# Patient Record
Sex: Female | Born: 1935 | Race: White | Hispanic: No | Smoking: Never smoker
Health system: Southern US, Community
[De-identification: ages and names within clinical notes are randomized; demographics above are authoritative.]

---

## 1998-11-08 ENCOUNTER — Ambulatory Visit (HOSPITAL_COMMUNITY): Admission: RE | Admit: 1998-11-08 | Discharge: 1998-11-08 | Payer: Self-pay | Admitting: Family Medicine

## 1999-07-31 ENCOUNTER — Other Ambulatory Visit: Admission: RE | Admit: 1999-07-31 | Discharge: 1999-07-31 | Payer: Self-pay | Admitting: Gynecology

## 1999-09-19 ENCOUNTER — Encounter: Payer: Self-pay | Admitting: Gynecology

## 1999-09-19 ENCOUNTER — Encounter: Admission: RE | Admit: 1999-09-19 | Discharge: 1999-09-19 | Payer: Self-pay | Admitting: Gynecology

## 2000-07-30 ENCOUNTER — Encounter: Admission: RE | Admit: 2000-07-30 | Discharge: 2000-07-30 | Payer: Self-pay | Admitting: Family Medicine

## 2000-07-30 ENCOUNTER — Encounter: Payer: Self-pay | Admitting: Family Medicine

## 2000-07-30 ENCOUNTER — Encounter (INDEPENDENT_AMBULATORY_CARE_PROVIDER_SITE_OTHER): Payer: Self-pay | Admitting: Specialist

## 2000-07-30 ENCOUNTER — Inpatient Hospital Stay (HOSPITAL_COMMUNITY): Admission: EM | Admit: 2000-07-30 | Discharge: 2000-08-12 | Payer: Self-pay | Admitting: Emergency Medicine

## 2000-09-27 ENCOUNTER — Encounter: Admission: RE | Admit: 2000-09-27 | Discharge: 2000-09-27 | Payer: Self-pay | Admitting: Family Medicine

## 2000-09-28 ENCOUNTER — Other Ambulatory Visit: Admission: RE | Admit: 2000-09-28 | Discharge: 2000-09-28 | Payer: Self-pay | Admitting: Gynecology

## 2000-10-19 ENCOUNTER — Encounter: Payer: Self-pay | Admitting: Gynecology

## 2000-10-19 ENCOUNTER — Encounter: Admission: RE | Admit: 2000-10-19 | Discharge: 2000-10-19 | Payer: Self-pay | Admitting: Gynecology

## 2001-10-11 ENCOUNTER — Other Ambulatory Visit: Admission: RE | Admit: 2001-10-11 | Discharge: 2001-10-11 | Payer: Self-pay | Admitting: Gynecology

## 2001-10-27 ENCOUNTER — Encounter: Payer: Self-pay | Admitting: Gynecology

## 2001-10-27 ENCOUNTER — Encounter: Admission: RE | Admit: 2001-10-27 | Discharge: 2001-10-27 | Payer: Self-pay | Admitting: Gynecology

## 2002-01-06 ENCOUNTER — Encounter (INDEPENDENT_AMBULATORY_CARE_PROVIDER_SITE_OTHER): Payer: Self-pay | Admitting: Specialist

## 2002-01-06 ENCOUNTER — Ambulatory Visit (HOSPITAL_BASED_OUTPATIENT_CLINIC_OR_DEPARTMENT_OTHER): Admission: RE | Admit: 2002-01-06 | Discharge: 2002-01-06 | Payer: Self-pay | Admitting: Gynecology

## 2002-10-25 ENCOUNTER — Other Ambulatory Visit: Admission: RE | Admit: 2002-10-25 | Discharge: 2002-10-25 | Payer: Self-pay | Admitting: Gynecology

## 2002-11-13 ENCOUNTER — Encounter: Payer: Self-pay | Admitting: Gynecology

## 2002-11-13 ENCOUNTER — Encounter: Admission: RE | Admit: 2002-11-13 | Discharge: 2002-11-13 | Payer: Self-pay | Admitting: Gynecology

## 2003-11-22 ENCOUNTER — Encounter: Admission: RE | Admit: 2003-11-22 | Discharge: 2003-11-22 | Payer: Self-pay | Admitting: Gynecology

## 2004-11-26 ENCOUNTER — Other Ambulatory Visit: Admission: RE | Admit: 2004-11-26 | Discharge: 2004-11-26 | Payer: Self-pay | Admitting: Gynecology

## 2005-01-14 ENCOUNTER — Encounter: Admission: RE | Admit: 2005-01-14 | Discharge: 2005-01-14 | Payer: Self-pay | Admitting: Gynecology

## 2005-01-23 ENCOUNTER — Encounter: Admission: RE | Admit: 2005-01-23 | Discharge: 2005-01-23 | Payer: Self-pay | Admitting: Gynecology

## 2005-03-12 ENCOUNTER — Encounter: Admission: RE | Admit: 2005-03-12 | Discharge: 2005-03-12 | Payer: Self-pay | Admitting: Gynecology

## 2005-03-12 ENCOUNTER — Encounter (INDEPENDENT_AMBULATORY_CARE_PROVIDER_SITE_OTHER): Payer: Self-pay | Admitting: *Deleted

## 2005-05-14 ENCOUNTER — Encounter: Admission: RE | Admit: 2005-05-14 | Discharge: 2005-05-14 | Payer: Self-pay | Admitting: Surgery

## 2005-11-18 ENCOUNTER — Encounter: Admission: RE | Admit: 2005-11-18 | Discharge: 2005-11-18 | Payer: Self-pay | Admitting: Gynecology

## 2006-05-19 ENCOUNTER — Encounter: Admission: RE | Admit: 2006-05-19 | Discharge: 2006-05-19 | Payer: Self-pay | Admitting: Gynecology

## 2006-12-15 ENCOUNTER — Other Ambulatory Visit: Admission: RE | Admit: 2006-12-15 | Discharge: 2006-12-15 | Payer: Self-pay | Admitting: Gynecology

## 2006-12-29 ENCOUNTER — Encounter: Admission: RE | Admit: 2006-12-29 | Discharge: 2006-12-29 | Payer: Self-pay | Admitting: Gynecology

## 2007-08-24 ENCOUNTER — Encounter: Admission: RE | Admit: 2007-08-24 | Discharge: 2007-08-24 | Payer: Self-pay | Admitting: Gynecology

## 2010-11-30 ENCOUNTER — Encounter: Payer: Self-pay | Admitting: Gynecology

## 2011-03-27 NOTE — Op Note (Signed)
[facility]  Patient:    Elaine Campbell, Elaine Campbell                           MRN: [id number] Proc. Date: 07/30/00 Attending:  Zigmund Daniel, M.D. CC:         Teena Irani. Arlyce Dice, M.D.   Operative Report  PREOPERATIVE DIAGNOSIS:  Probable acute cholecystitis.  POSTOPERATIVE DIAGNOSIS: 1. Perforated appendicitis with abscess and peritonitis. 2. Acute and chronic cholecystitis.  OPERATION: 1. Laparoscopy. 2. Appendectomy. 3. Cholecystectomy (open procedure)  SURGEON:  Zigmund Daniel, M.D.  ANESTHESIA: General.  DESCRIPTION OF PROCEDURE:  After adequate monitoring and uneventful induction of anesthesia and routine preparation and draping of the abdomen, I made a short, transverse infraumbilical incision and carried my dissection down to the fascia, opened it longitudinally, and bluntly entered the peritoneal cavity.  No adhesions were present in the midline area.  I then placed a Hasson cannula and secured with O Vicryl pursestring suture in the fascia and inflated the abdomen with CO2.  I put in a laparoscope and noted fluid and inflammation in the right lower quadrant.  I put in a mid abdominal port and dissected a bit.  There were adhesions of the cecum to the anterior abdominal wall, and there was severe inflammation at the tip of the cecum.  I was not able to adequately dissect the area, so I abandoned the laparoscopic procedure.  I made a midline laparotomy and dissected into the right lower quadrant and pelvis.  I entered a large abscess cavity bounded by small bowel which was beginning to have an obstructive appearance.  I suctioned out the abscess contents and noted that the appendix was in it and appeared to be perforated.  This appeared to be of several days duration as there was considerable chronic inflammation.  There was a fair amount of free fluid. There was abscess in the pelvis along with a good deal of adherent small bowel.  I took  cultures of the abscesses.  The cecum and appendix were rather severely inflamed, and I did not feel I would be able to perform an appendectomy without taking out the cecum.  I mobilized the right colon and the distal ileum back to healthy appearing bowel.  I divided the bowel proximally and distally with a cutting stapler and divided intervening mesentery between clamps and ligated the vessels with 2-0 silks and 2-0 silk ligatures.  I did a side-to-side anastomosis of the ends of the bowel using the cutting stapler and saw that hemostasis was good on the staple lines.  I closed the remaining common defect with the linear stapler and reinforced it with silk suture.  It appeared to be well vascularized and secure closure.  I closed the mesenteric defect with 2-0 silk.  I then further irrigated the abdominal cavity and removed the irrigant.  I felt up in the right upper quadrant and found that the gallbladder was severely scarred and extremely distended and hard.  I felt that it should be removed.  I extended my midline incision upward to adequately expose the gallbladder.  I took down the chronic and acute adhesions of the omentum tothe undersurface of the gallbladder.  I then decompressed the gallbladder with the suction aspirator.  It contained very inspissated bile.  There was a very large, hard stone in the infundibulum and cystic duct area.  It appeared to be obstructing the gallbladder.  I then worked from the  fundus of the gallbladder downward using the cautery and scissors to dissect it out of the liver, getting hemostasis with the cautery.  I dissected down until the gallbladder remained attached to the liver only by the cystic duct and the cystic artery. I clipped the cystic artery with three clips and cut between the two closer to the gallbladder.  I clipped the cystic duct with three clips and then removed the gallbladder.  I copiously irrigated the area and removed the  irrigant. The clips were secure, and there was no evident bleeding or leakage of bile.  Sponge, needle, and instrument counts were correct.  I closed the fascia with running #1 PDS and closed the skin loosely with staples with some saline-soaked gauze packed into the ______ .  The patient tolerated the operation well. DD:  07/30/00 TD:  08/02/00 Job: [id number]

## 2011-03-27 NOTE — Discharge Summary (Signed)
[facility]  Patient:    Elaine Campbell, Elaine Campbell                         MRN: [id number] Adm. Date:  [id number] Disc. Date: [id number] Attending:  Meredith Leeds CC:         Teena Irani. Arlyce Dice, M.D.   Discharge Summary  HISTORY OF PRESENT ILLNESS:  The patient is a 77 year old white female who has had recent upper abdominal pain. She had not responded to treatment for constipation and reflux. The sound of the gallbladder showed evidence of acute cholecystitis and cholelithiasis. I was consulted to see the patient at the emergency department. See history and physical for further details. Prominent findings are that she is allergic to PENICILLIN. She is only on hormone replacement therapy and vitamins. Her only previous surgery was a knee arthroscopy.  PHYSICAL EXAMINATION:  Remarkably only for tenderness in the abdomen, greatest in the right lower quadrant.  HOSPITAL COURSE:  WBC count was elevated. It was felt that the patient possibly had a ruptured appendix. I advised immediate surgery. She underwent laparoscopy and I found that there was both inflammation of the gallbladder and inflammation in the right lower quadrant. I was not able to do successful laparoscopic surgery. I did a midline laparoscopy and found that she had a ruptured appendix with abscess and a great deal of surrounding inflammation. This required a partial colectomy with anastomosis. I also did a cholecystectomy. Postoperatively, she had rather prolonged ileus. She was treated with broad spectrum antibiotics and her fever and elevated white count gradually went away. Her GI tract function returned slowly but did come back to her normal state of bowel movements and passage of flatus and softening of the abdomen. She developed no wound infection. On August 12, 2000, she felt well and desired discharge. Felt that no further antibiotic treatment was necessary. I sent her home and made  arrangements for early follow-up in the office.  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 There was acute suppurative appendicitis with rupture. Gallbladder showed chronic cholecystitis and cholelithiasis.  DISCHARGE DIAGNOSES: 1. Perforated appendicitis with peritonitis and abscess. 2. Cholecystitis and cholelithiasis.  OPERATION PERFORMED:  Right colectomy and cholecystectomy.  CONDITION ON DISCHARGE:  Improved and further improvement anticipated. DD:  08/31/00 TD:  08/31/00 Job: [id number]

## 2011-03-27 NOTE — H&P (Signed)
[facility]  Patient:    Elaine Campbell, Elaine Campbell                         MRN: [id number] Adm. Date:  [id number] Attending:  Meredith Leeds                         History and Physical  CHIEF COMPLAINT:  Abdominal pain.  HISTORY OF PRESENT ILLNESS:  The patient is a 77 year old white female who is generally healthy.  For the past several days, she has had increasing diffuse abdominal pain variously worse in different locations.  There has also been nausea.  She originally had trouble after eating some chili and there was some thought that it might be reflux.  She also had had evidence of constipation, but taking enemas did not help her.  Laxatives also did not help.  She underwent an ultrasound of the abdomen requested by Barbette Hair. Arlyce Dice, M.D., and it demonstrated gallstones with evidence of acute cholecystitis.  The patient is sent to the emergency department for me to see her and take care of her.  She has no chronic GI problems.  PAST MEDICAL HISTORY:  She is generally healthy.  ALLERGIES:  She is allergic to PENICILLIN.  SOCIAL HISTORY:  She does not smoke or drink.  She denies serious chronic ailments.  MEDICATIONS:  Her only medicines are hormone-replacement therapy and vitamins.  PAST SURGICAL HISTORY:  Her only surgery was a knee arthroscopy.  REVIEW OF SYSTEMS:  She denies heart and lung problems.  FAMILY HISTORY:  Unremarkable.  CHILDHOOD ILLNESSES:  Unremarkable.  PHYSICAL EXAMINATION:  The temperature and vital signs were unremarkable, per nursing staff.  GENERAL APPEARANCE:  Her mental status is normal.  HEENT:  Unremarkable.  NECK:  Unremarkable.  CHEST:  Clear to auscultation.  BREASTS:  Normal.  HEART:  Rate and rhythm normal.  No murmur or gallop.  ABDOMEN:  Generally tender, but most tender in the right lower quadrant.  A few bowel sounds are present.  Slight rebound tenderness present in the right lower quadrant.  No  masses noted.  RECTAL:  Unremarkable.  EXTREMITIES:  Normal.  LYMPH NODES:  None enlarged.  NEUROLOGIC:  Normal.  IMPRESSION:  Probable acute cholecystitis, but possible acute appendicitis.  PLAN:  Immediate laparoscopy and treatment of findings as appropriate. DD:  07/31/00 TD:  07/31/00 Job: [id number]

## 2011-03-27 NOTE — Op Note (Signed)
[facility]  Patient:    Elaine Campbell, Elaine Campbell Visit Number: [id number] MRN: [id number]          Service Type: NES Location: [facility] Attending Physician:  Katrina Stack Dictated by:   Gretta Cool, M.D. Proc. Date: 01/06/02 Admit Date:  01/06/2002                             Operative Report  PREOPERATIVE DIAGNOSIS:  Endometrial polyp with abnormal uterine bleeding.  POSTOPERATIVE DIAGNOSIS:  Endometrial polyp with abnormal uterine bleeding.  PROCEDURES:  Hysteroscopy, dilatation and curettage, resection of endometrial polyp, total endometrial resection for ablation.  SURGEON: Gretta Cool, M.D.  ANESTHESIA:  IV sedation and paracervical block.  DESCRIPTION OF PROCEDURE:  Under excellent anesthesia as above with the patient prepped and draped in Allen stirrups and her bladder drained, the cervix was grasped with a single-tooth tenaculum and pulled down into view. Cervix was then progressively dilated with a series of Pratt dilators to accommodate a 7 mm resectoscope.  The endometrial cavity was then systematically examined.  A polyp identified and photographed on the posterior wall of the uterus.  The cavity was then examined by curettage.  At this point the polyp was resected with the resectoscope and then the entire endometrium removed by resection with a 90 degree resectoscope loop.  The entire cavity was then also treated by VapoTrode so as to produce total endometrial ablation and to remove any viable remaining endometrial implants.  At this point the procedure was terminated without complication, no significant bleeding at reduced pressure, less than 100 cc fluid deficit. Dictated by:   Gretta Cool, M.D. Attending Physician:  Katrina Stack DD:  01/06/02 TD:  01/06/02 Job: [id number]

## 2019-08-17 ENCOUNTER — Other Ambulatory Visit: Payer: Self-pay | Admitting: Adult Medicine

## 2019-08-17 DIAGNOSIS — I10 Essential (primary) hypertension: Secondary | ICD-10-CM

## 2019-08-17 DIAGNOSIS — I77810 Thoracic aortic ectasia: Secondary | ICD-10-CM

## 2019-08-17 DIAGNOSIS — I159 Secondary hypertension, unspecified: Secondary | ICD-10-CM

## 2019-08-17 DIAGNOSIS — R911 Solitary pulmonary nodule: Secondary | ICD-10-CM

## 2019-08-17 DIAGNOSIS — N1832 Chronic kidney disease, stage 3b: Secondary | ICD-10-CM

## 2019-08-18 ENCOUNTER — Other Ambulatory Visit: Payer: Self-pay | Admitting: Adult Medicine

## 2019-08-18 DIAGNOSIS — I158 Other secondary hypertension: Secondary | ICD-10-CM

## 2019-08-18 DIAGNOSIS — I77810 Thoracic aortic ectasia: Secondary | ICD-10-CM

## 2019-08-18 DIAGNOSIS — R911 Solitary pulmonary nodule: Secondary | ICD-10-CM

## 2019-08-23 ENCOUNTER — Other Ambulatory Visit (HOSPITAL_COMMUNITY): Payer: Self-pay | Admitting: Adult Medicine

## 2019-08-23 DIAGNOSIS — I77810 Thoracic aortic ectasia: Secondary | ICD-10-CM

## 2019-08-24 ENCOUNTER — Other Ambulatory Visit: Payer: Self-pay

## 2019-08-24 ENCOUNTER — Inpatient Hospital Stay: Admission: RE | Admit: 2019-08-24 | Payer: Self-pay | Source: Ambulatory Visit

## 2019-08-25 ENCOUNTER — Other Ambulatory Visit: Payer: Self-pay

## 2019-08-25 ENCOUNTER — Ambulatory Visit (HOSPITAL_COMMUNITY)
Admission: RE | Admit: 2019-08-25 | Discharge: 2019-08-25 | Disposition: A | Discharge status: Home / Self Care | Payer: Medicare Other | Source: Ambulatory Visit | Attending: Family | Admitting: Family

## 2019-08-25 DIAGNOSIS — I77810 Thoracic aortic ectasia: Secondary | ICD-10-CM | POA: Diagnosis present

## 2019-08-31 ENCOUNTER — Ambulatory Visit
Admission: RE | Admit: 2019-08-31 | Discharge: 2019-08-31 | Disposition: A | Discharge status: Home / Self Care | Payer: Medicare Other | Source: Ambulatory Visit | Attending: Adult Medicine | Admitting: Adult Medicine

## 2019-08-31 ENCOUNTER — Other Ambulatory Visit: Payer: Self-pay

## 2019-08-31 DIAGNOSIS — R911 Solitary pulmonary nodule: Secondary | ICD-10-CM

## 2019-08-31 DIAGNOSIS — I77810 Thoracic aortic ectasia: Secondary | ICD-10-CM

## 2019-08-31 DIAGNOSIS — I158 Other secondary hypertension: Secondary | ICD-10-CM

## 2019-08-31 MED ORDER — IOPAMIDOL (ISOVUE-370) INJECTION 76%
75.0000 mL | Freq: Once | INTRAVENOUS | Status: AC | PRN
  Administered 2019-08-31: 75 mL via INTRAVENOUS

## 2019-09-21 ENCOUNTER — Telehealth (HOSPITAL_COMMUNITY): Payer: Self-pay | Admitting: *Deleted

## 2019-09-21 NOTE — Telephone Encounter (Signed)
Attempted to reach pt on H and M #s to schedule appt requested by [facility].

## 2019-09-25 ENCOUNTER — Encounter (HOSPITAL_COMMUNITY): Payer: Self-pay

## 2019-09-25 ENCOUNTER — Other Ambulatory Visit: Payer: Self-pay

## 2019-09-25 ENCOUNTER — Other Ambulatory Visit (HOSPITAL_COMMUNITY): Payer: Self-pay | Admitting: Adult Medicine

## 2019-09-25 ENCOUNTER — Ambulatory Visit (HOSPITAL_COMMUNITY)
Admission: RE | Admit: 2019-09-25 | Discharge: 2019-09-25 | Disposition: A | Discharge status: Home / Self Care | Payer: Medicare Other | Source: Ambulatory Visit | Attending: Family | Admitting: Family

## 2019-09-25 DIAGNOSIS — I10 Essential (primary) hypertension: Secondary | ICD-10-CM

## 2020-04-29 ENCOUNTER — Other Ambulatory Visit: Payer: Self-pay | Admitting: Gastroenterology

## 2020-05-02 ENCOUNTER — Other Ambulatory Visit: Payer: Self-pay | Admitting: Gastroenterology

## 2020-05-15 ENCOUNTER — Other Ambulatory Visit: Payer: Self-pay | Admitting: Gastroenterology

## 2020-05-15 DIAGNOSIS — K838 Other specified diseases of biliary tract: Secondary | ICD-10-CM

## 2020-05-21 ENCOUNTER — Other Ambulatory Visit: Payer: Self-pay | Admitting: Adult Medicine

## 2020-05-21 DIAGNOSIS — I10 Essential (primary) hypertension: Secondary | ICD-10-CM

## 2020-05-30 ENCOUNTER — Ambulatory Visit
Admission: RE | Admit: 2020-05-30 | Discharge: 2020-05-30 | Disposition: A | Discharge status: Home / Self Care | Payer: Medicare Other | Source: Ambulatory Visit | Attending: Adult Medicine | Admitting: Adult Medicine

## 2020-05-30 DIAGNOSIS — I10 Essential (primary) hypertension: Secondary | ICD-10-CM

## 2020-06-16 ENCOUNTER — Ambulatory Visit
Admission: RE | Admit: 2020-06-16 | Discharge: 2020-06-16 | Disposition: A | Discharge status: Home / Self Care | Payer: Medicare Other | Source: Ambulatory Visit | Attending: Gastroenterology | Admitting: Gastroenterology

## 2020-06-16 ENCOUNTER — Other Ambulatory Visit: Payer: Self-pay | Admitting: Gastroenterology

## 2020-06-16 ENCOUNTER — Other Ambulatory Visit: Payer: Self-pay

## 2020-06-16 DIAGNOSIS — K838 Other specified diseases of biliary tract: Secondary | ICD-10-CM

## 2021-01-06 ENCOUNTER — Other Ambulatory Visit: Payer: Self-pay | Admitting: Adult Medicine

## 2021-01-06 DIAGNOSIS — M549 Dorsalgia, unspecified: Secondary | ICD-10-CM

## 2021-01-23 ENCOUNTER — Ambulatory Visit
Admission: RE | Admit: 2021-01-23 | Discharge: 2021-01-23 | Disposition: A | Discharge status: Home / Self Care | Payer: Medicare Other | Source: Ambulatory Visit | Attending: Adult Medicine | Admitting: Adult Medicine

## 2021-01-23 ENCOUNTER — Other Ambulatory Visit: Payer: Self-pay

## 2021-01-23 DIAGNOSIS — M549 Dorsalgia, unspecified: Secondary | ICD-10-CM

## 2022-07-16 IMAGING — MR MR LUMBAR SPINE W/O CM
4 of 5 series · 27 of 48 positions shown · non-contrast
Comparison: CT of the abdomen 08/31/2019

CLINICAL DATA: Back pain for 2 years

EXAM:
MRI LUMBAR SPINE WITHOUT CONTRAST
TECHNIQUE: Multiplanar, multisequence MR imaging of the lumbar spine was
performed. No intravenous contrast was administered.

[Series 3: T2 · sagittal · 4.0mm · 1.09mm/px · 6 of 17 slices shown (1 of 2)]
[im 1/17]
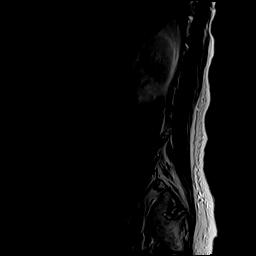
[im 4/17]
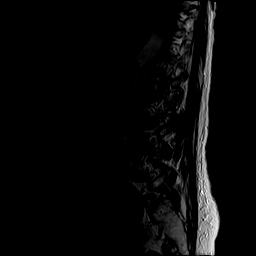
[im 7/17]
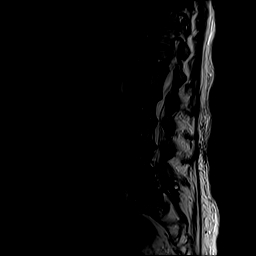
[im 10/17]
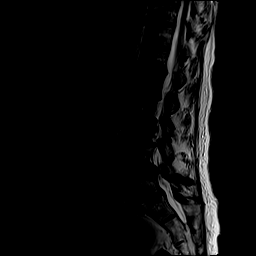
[im 13/17]
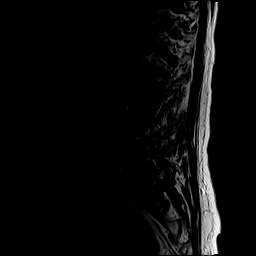
[im 17/17]
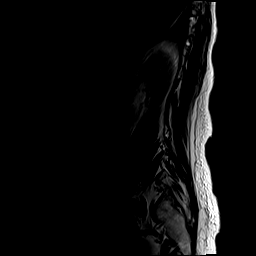

[Series 5: T1 · sagittal · 4.0mm · 1.09mm/px · 6 of 17 slices shown (1 of 2)]
[im 1/17]
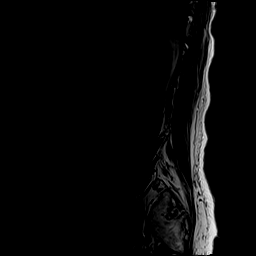
[im 4/17]
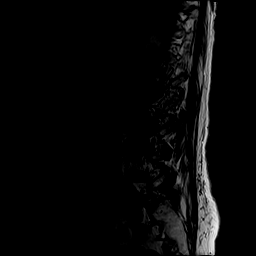
[im 7/17]
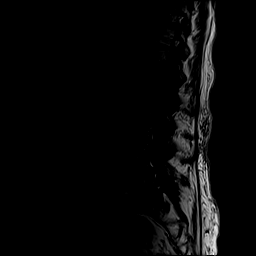
[im 10/17]
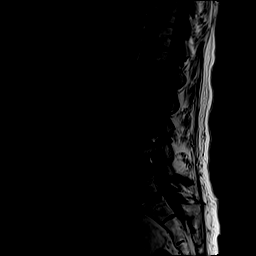
[im 13/17]
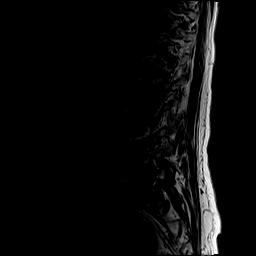
[im 17/17]
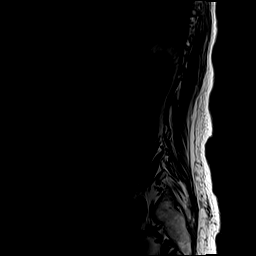

[Series 6: T2 · axial · 4.0mm · 0.39mm/px · z∈[-98,+102]mm · 9 of 40 slices shown (2 of 2)]
[im 1/40]
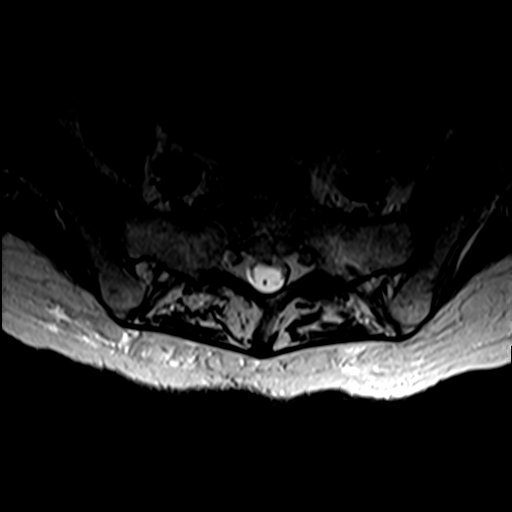
[im 6/40]
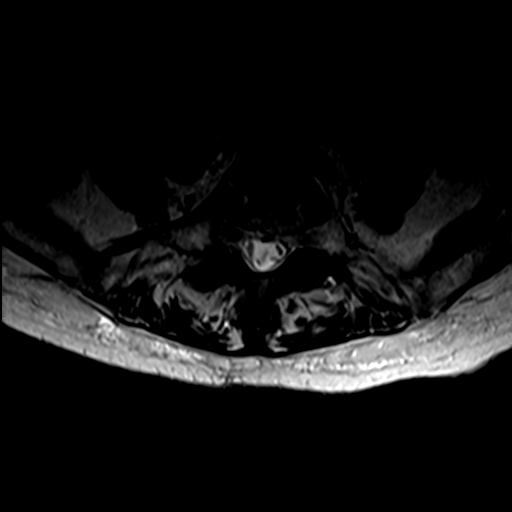
[im 12/40]
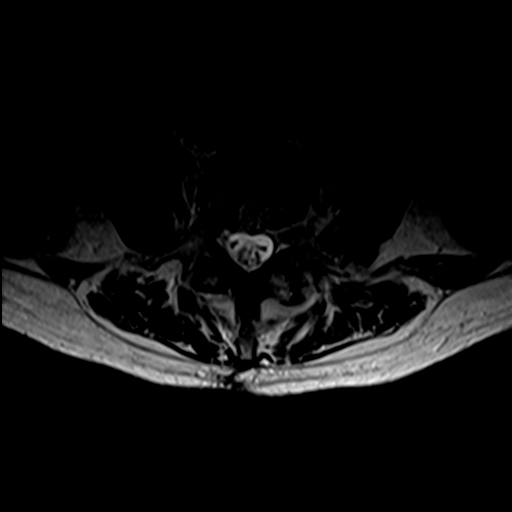
[im 17/40]
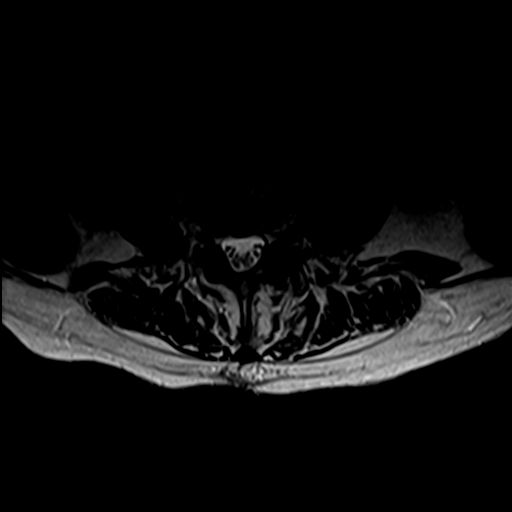
[im 20/40]
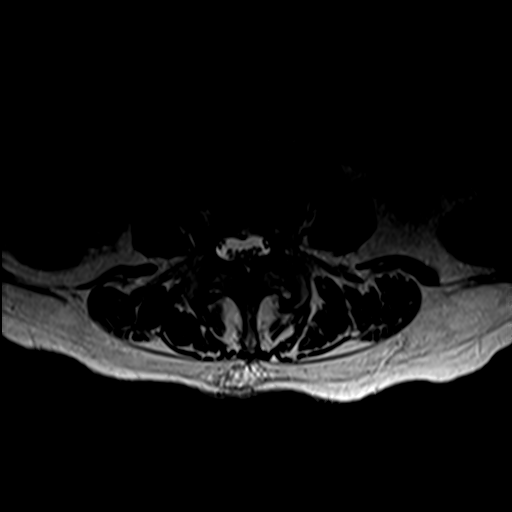
[im 23/40]
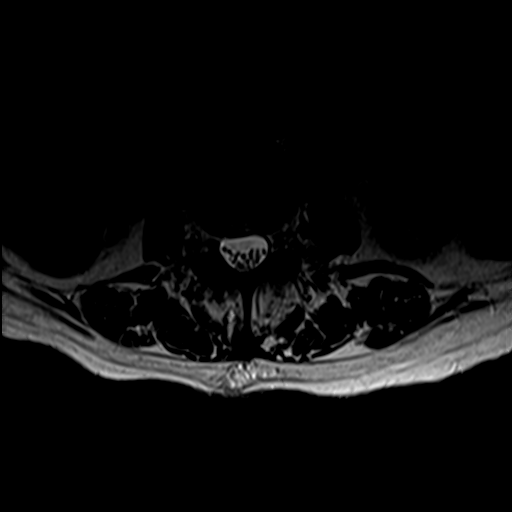
[im 28/40]
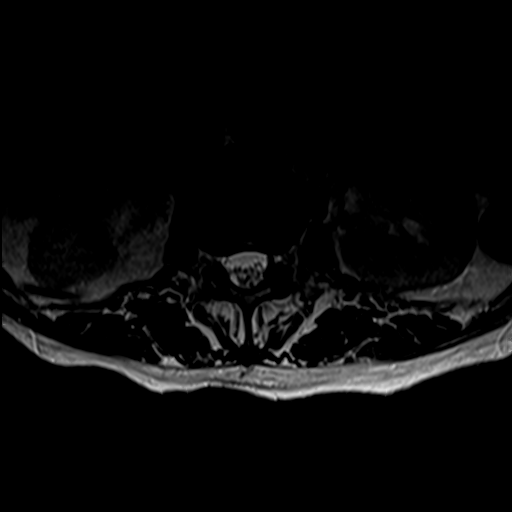
[im 34/40]
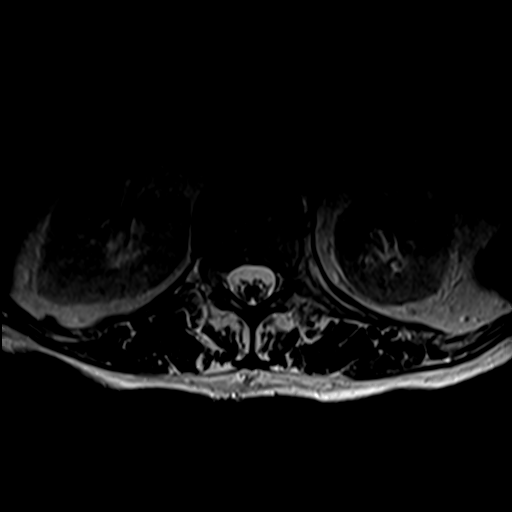
[im 40/40]
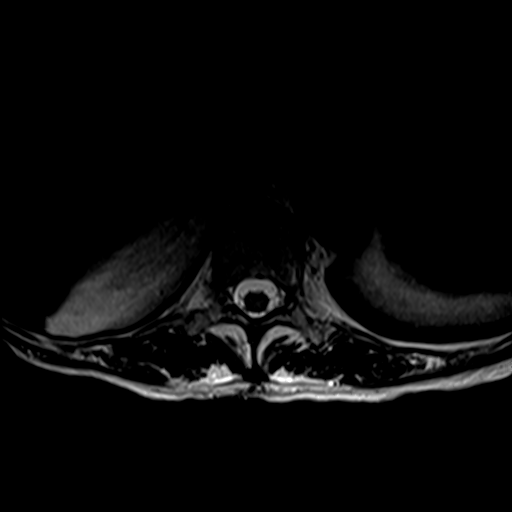

[Series 7: T1 · axial · 4.0mm · 0.39mm/px · z∈[-98,+74]mm · 6 of 40 slices shown (2 of 2)]
[im 1/40]
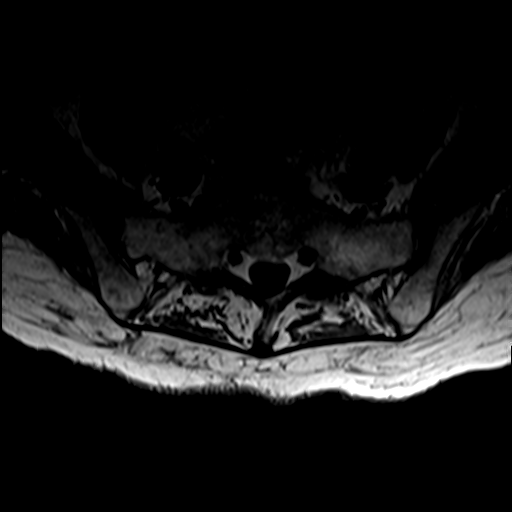
[im 6/40]
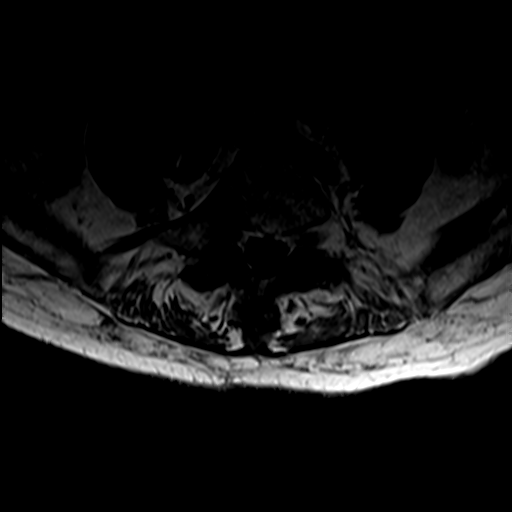
[im 12/40]
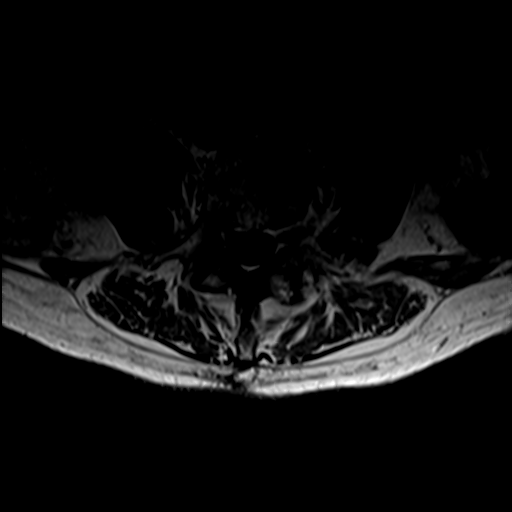
[im 17/40]
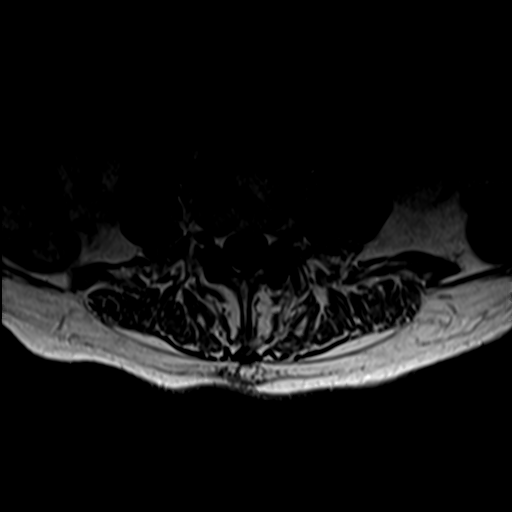
[im 20/40]
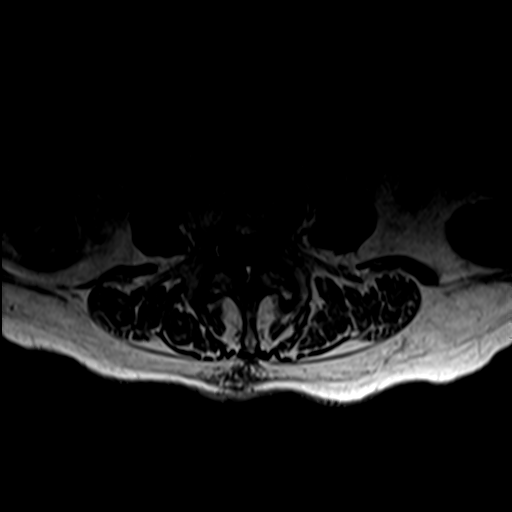
[im 34/40]
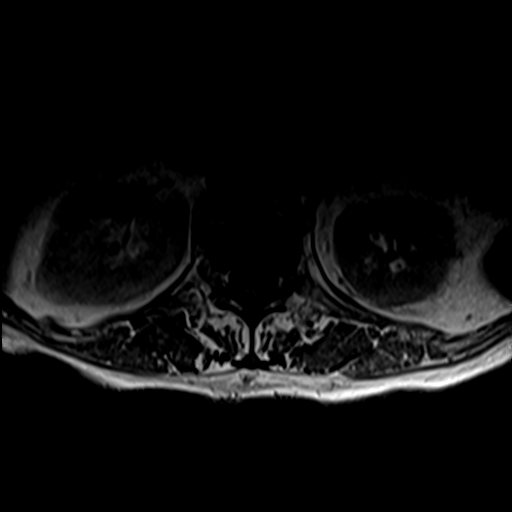

[27 of 48 positions shown; findings below may reference images not displayed]

FINDINGS: Segmentation:  5 lumbar type vertebrae

Alignment: Degenerative grade 1 anterolisthesis at L2-3, L4-5, and
L5-S1. Mild dextroscoliosis

Vertebrae: Remote L1 compression fracture. Height loss is up to
moderate anteriorly. Degenerative marrow edema about the right L5-S1
facet.

Conus medullaris and cauda equina: Conus extends to the T12-L1
level. Conus and cauda equina appear normal.

Paraspinal and other soft tissues: Negative

Disc levels:

T12- L1: Disc narrowing with mild bulging and endplate ridging.

L1-L2: Disc narrowing and bulging.  Negative facets

L2-L3: Facet spurring with mild anterolisthesis. Disc narrowing and
bulging. Mild spinal stenosis.

L3-L4: Disc collapse asymmetric to the left where there is endplate
ridging. Degenerative facet spurring. Moderate left foraminal
narrowing. Mild spinal stenosis with asymmetric left facet spur
encroaching but not clearly compressing the descending left L4 nerve
root.

L4-L5: Degenerative facet spurring with anterolisthesis and
posterior right synovial cyst. Disc narrowing and bulging. Advanced
and compressive spinal stenosis. Noncompressive bilateral foraminal
narrowing

L5-S1:Facet osteoarthritis with spurring and anterolisthesis. The
disc is narrowed and bulging. Bilateral subarticular recess stenosis
which could affect either S1 nerve root. Right foraminal
impingement.
IMPRESSION: 1. Generalized disc and facet degeneration with multilevel
listhesis.
2. L4-5 advanced spinal stenosis.
3. L5-S1 bilateral subarticular recess narrowing that could affect
either S1 nerve root. Right foraminal impingement.
4. L5-S1 active facet arthritis on the right.
5. Remote compression fracture at L1.

## 2022-07-16 IMAGING — MR MR THORACIC SPINE W/O CM
4 of 6 series · 18 of 48 positions shown · non-contrast
Comparison: None.

CLINICAL DATA: Midthoracic back pain for 2 years

EXAM:
MRI THORACIC SPINE WITHOUT CONTRAST
TECHNIQUE: Multiplanar, multisequence MR imaging of the thoracic spine was
performed. No intravenous contrast was administered.

[Series 4: T2 · sagittal · 4.0mm · 0.47mm/px · 6 of 17 slices shown (1 of 3)]
[im 1/17]
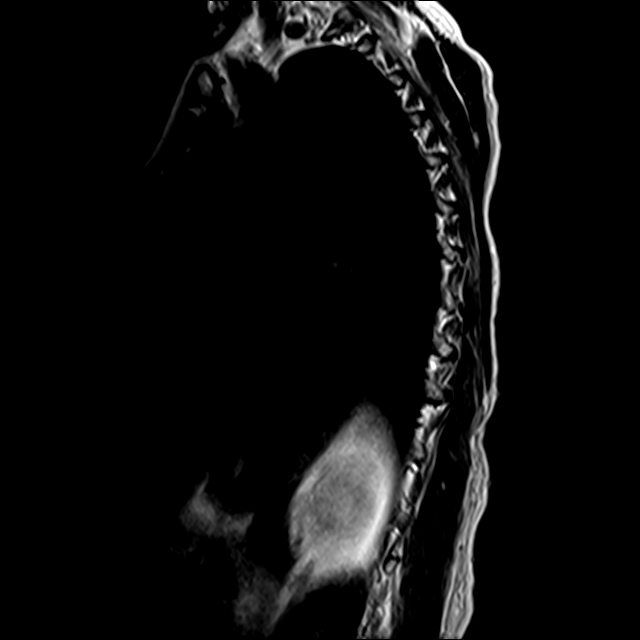
[im 4/17]
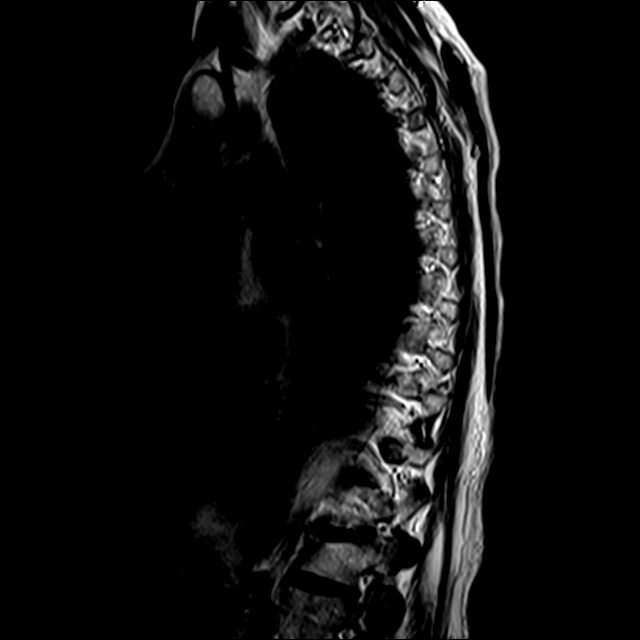
[im 7/17]
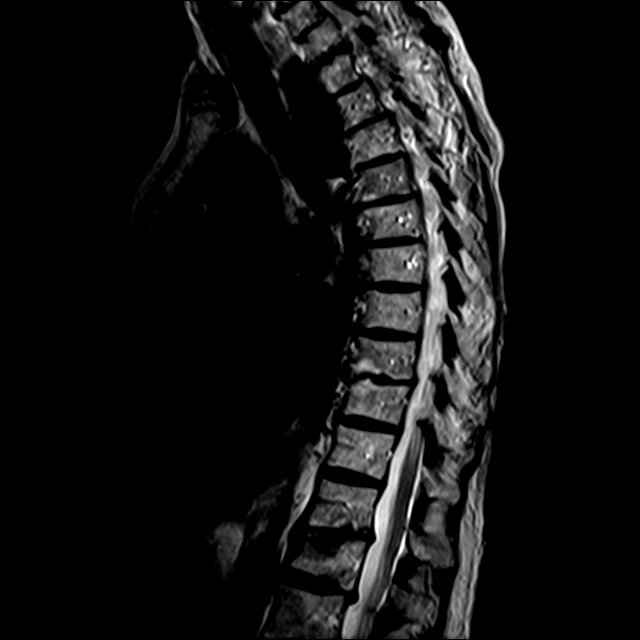
[im 10/17]
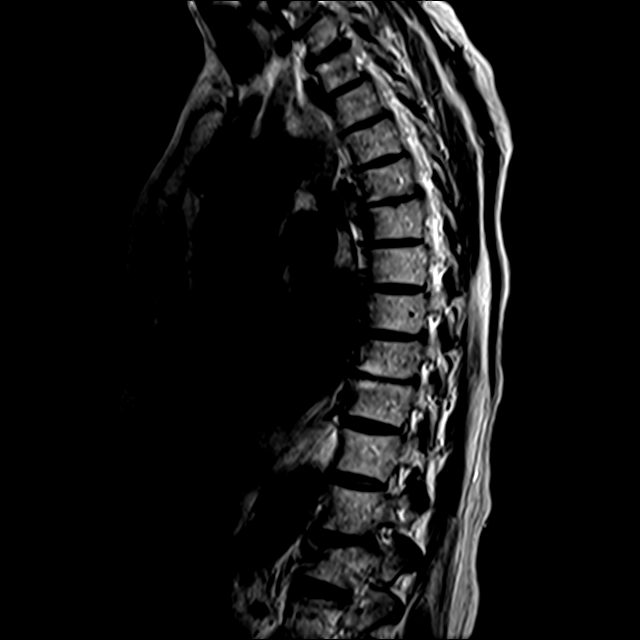
[im 13/17]
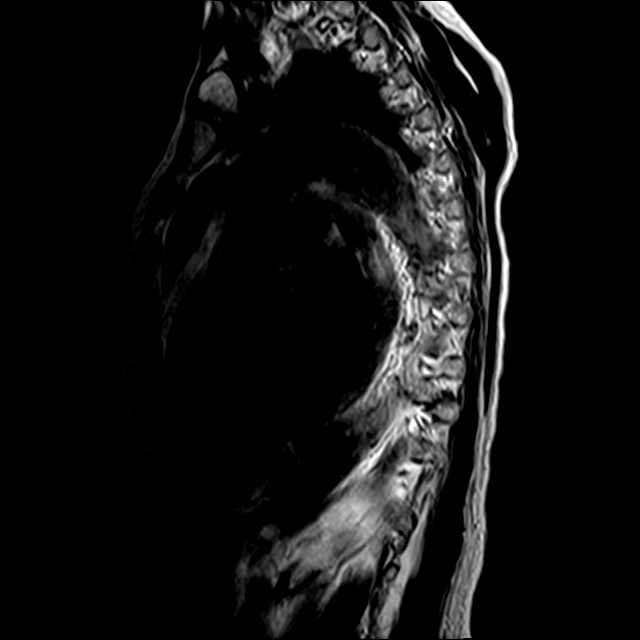
[im 17/17]
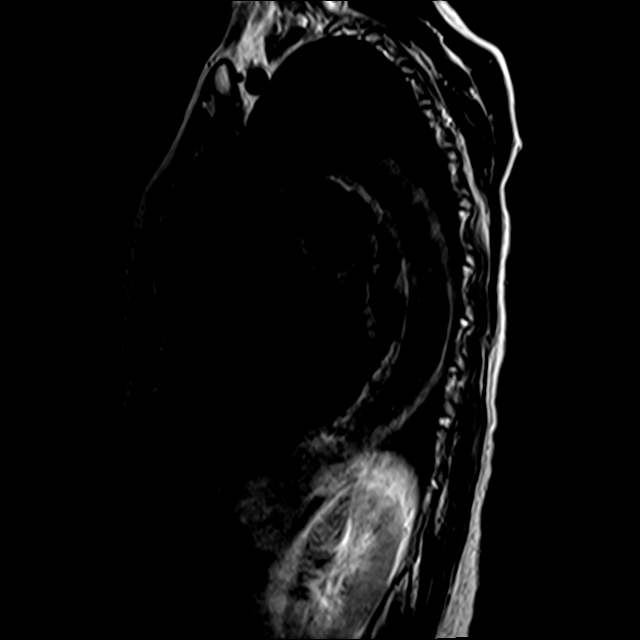

[Series 6: T1 · sagittal · 4.0mm · 0.94mm/px · 3 of 17 slices shown]
[im 4/17]
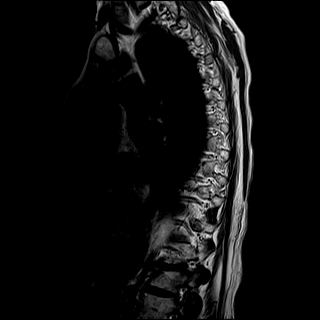
[im 10/17]
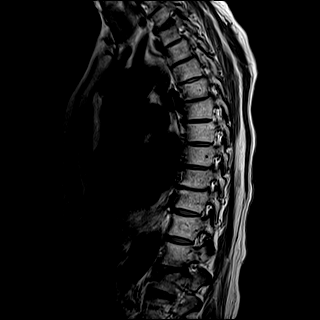
[im 17/17]
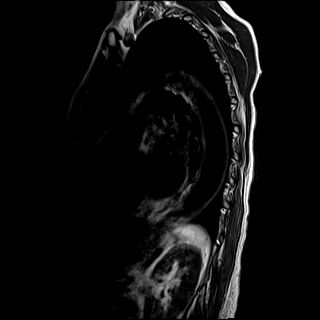

[Series 7: T2 · axial · 4.0mm · 0.39mm/px · z∈[-223,-72]mm · 6 of 36 slices shown (2 of 3)]
[im 1/36]
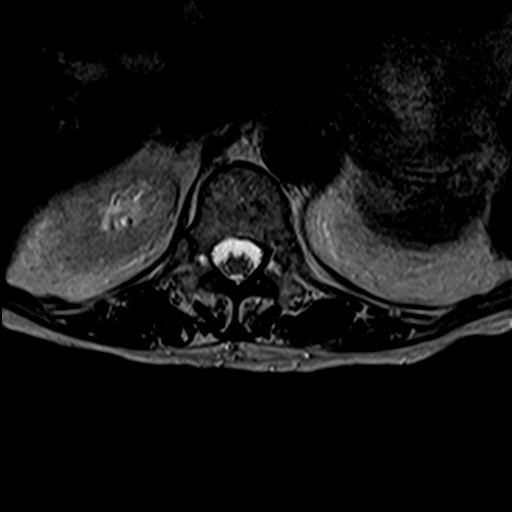
[im 7/36]
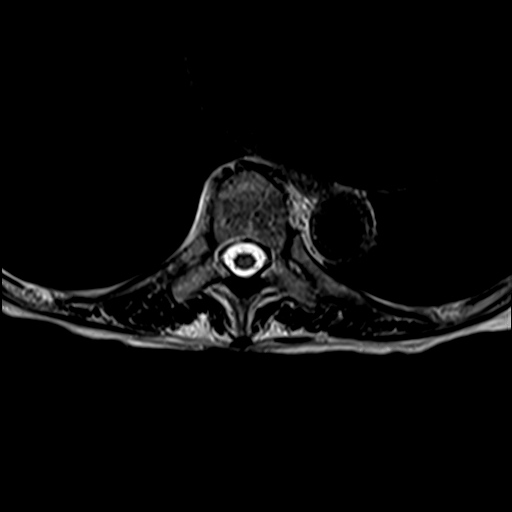
[im 10/36]
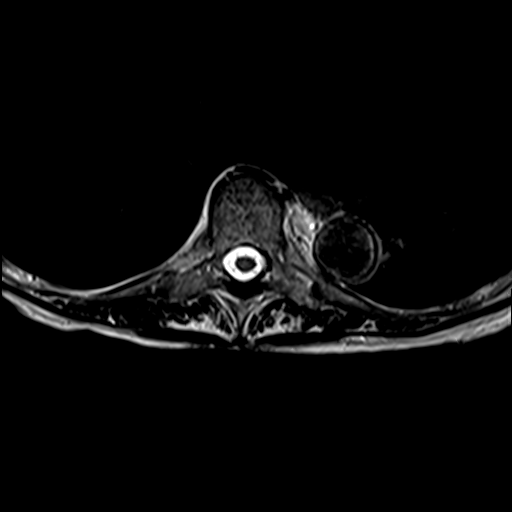
[im 16/36]
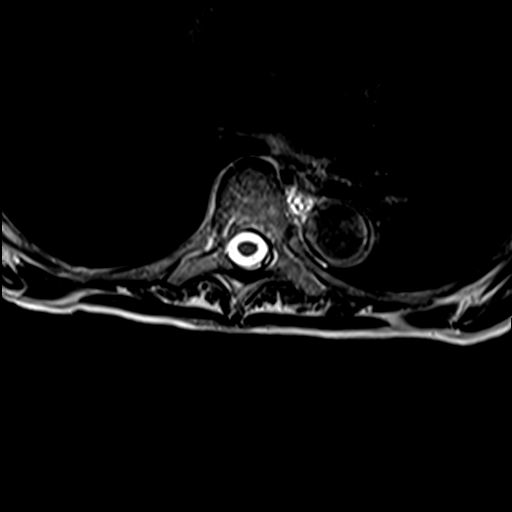
[im 20/36]
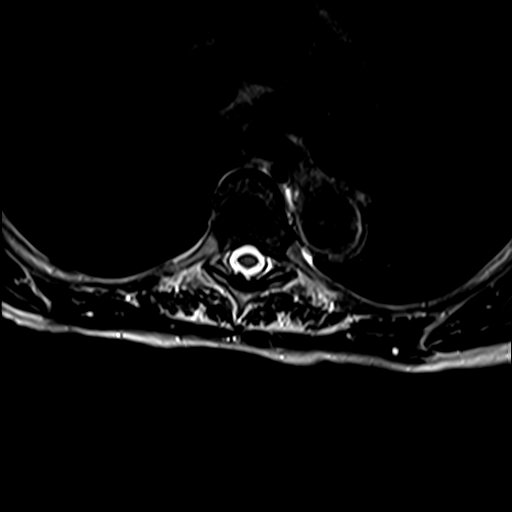
[im 32/36]
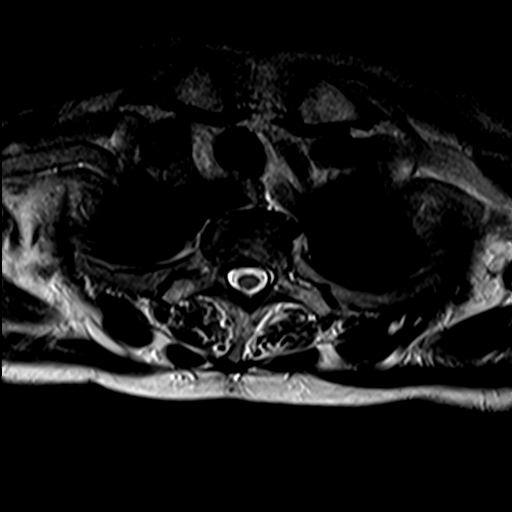

[Series 8: T2 · axial · 4.0mm · 0.39mm/px · z∈[-181,-72]mm · 3 of 36 slices shown (3 of 3)]
[im 7/36]
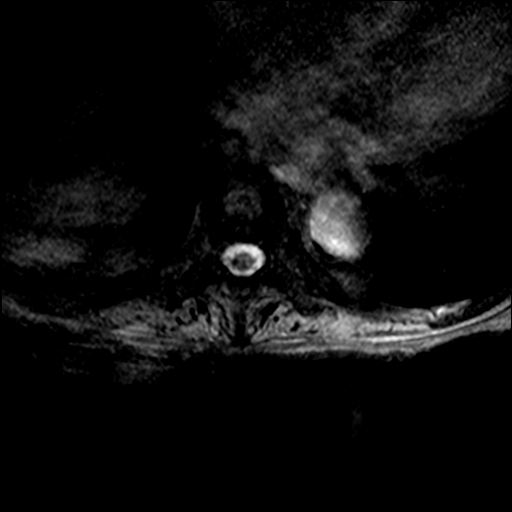
[im 20/36]
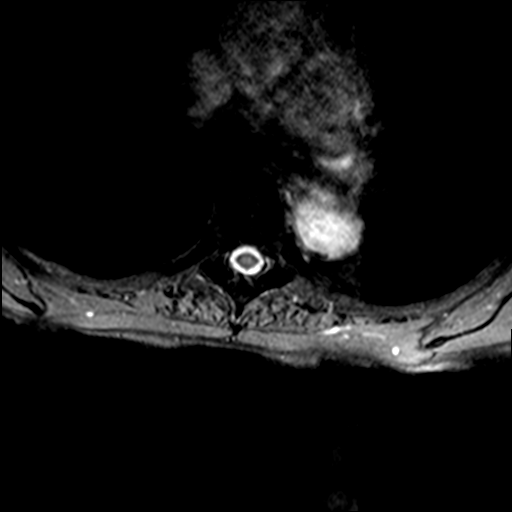
[im 32/36]
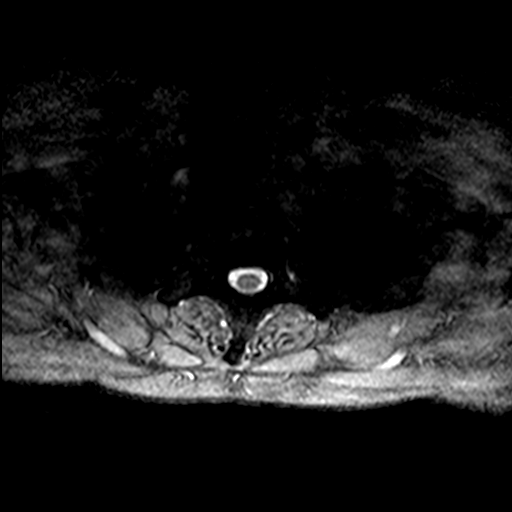

[18 of 48 positions shown; findings below may reference images not displayed]

FINDINGS: Alignment: Degenerative subluxations partially covered in the
cervical spine. There is C7-T1 mild anterolisthesis with facet
ankylosis.

Slight T1-2 retrolisthesis and T2-3, T3-4 anterolisthesis.

Vertebrae: L1 compression fracture remotely, as described on
dedicated study. Minor discogenic endplate edema at T5-6, T9-10 and
possibly C7-T2. No evidence of bone lesion.

Cord:  Normal signal and morphology.

Paraspinal and other soft tissues: Negative

Disc levels:

Generalized disc desiccation and narrowing with notable disc
collapse at T9-10.

Multilevel degenerative facet spurring most notable at T1-2 where
there mild spinal stenosis and right foraminal impingement.
IMPRESSION: 1. Generalized disc and facet degeneration with levels of mild
discogenic edema.
2. T1-2 right foraminal impingement and mild spinal stenosis.

## 2023-04-02 ENCOUNTER — Emergency Department (HOSPITAL_COMMUNITY): Payer: Medicare Other

## 2023-04-02 ENCOUNTER — Emergency Department (HOSPITAL_COMMUNITY)
Admission: EM | Admit: 2023-04-02 | Discharge: 2023-04-02 | Disposition: A | Discharge status: Skilled Nursing Facility | Payer: Medicare Other | Attending: Emergency Medicine | Admitting: Emergency Medicine

## 2023-04-02 ENCOUNTER — Other Ambulatory Visit: Payer: Self-pay

## 2023-04-02 DIAGNOSIS — M25572 Pain in left ankle and joints of left foot: Secondary | ICD-10-CM | POA: Insufficient documentation

## 2023-04-02 DIAGNOSIS — I160 Hypertensive urgency: Secondary | ICD-10-CM

## 2023-04-02 DIAGNOSIS — I129 Hypertensive chronic kidney disease with stage 1 through stage 4 chronic kidney disease, or unspecified chronic kidney disease: Secondary | ICD-10-CM | POA: Insufficient documentation

## 2023-04-02 DIAGNOSIS — N183 Chronic kidney disease, stage 3 unspecified: Secondary | ICD-10-CM | POA: Diagnosis not present

## 2023-04-02 DIAGNOSIS — D649 Anemia, unspecified: Secondary | ICD-10-CM | POA: Diagnosis not present

## 2023-04-02 LAB — BASIC METABOLIC PANEL
Anion gap: 9 (ref 5–15)
BUN: 63 mg/dL — ABNORMAL HIGH (ref 8–23)
CO2: 23 mmol/L (ref 22–32)
Calcium: 9.1 mg/dL (ref 8.9–10.3)
Chloride: 105 mmol/L (ref 98–111)
Creatinine, Ser: 1.27 mg/dL — ABNORMAL HIGH (ref 0.44–1.00)
GFR, Estimated: 40 mL/min — ABNORMAL LOW (ref >60)
Glucose, Bld: 104 mg/dL — ABNORMAL HIGH (ref 70–99)
Potassium: 4.1 mmol/L (ref 3.5–5.1)
Sodium: 137 mmol/L (ref 135–145)

## 2023-04-02 LAB — BRAIN NATRIURETIC PEPTIDE: B Natriuretic Peptide: 122.2 pg/mL — ABNORMAL HIGH (ref 0.0–100.0)

## 2023-04-02 LAB — CBC
HCT: 30.4 % — ABNORMAL LOW (ref 36.0–46.0)
Hemoglobin: 10.1 g/dL — ABNORMAL LOW (ref 12.0–15.0)
MCH: 33.1 pg (ref 26.0–34.0)
MCHC: 33.2 g/dL (ref 30.0–36.0)
MCV: 99.7 fL (ref 80.0–100.0)
Platelets: 217 10*3/uL (ref 150–400)
RBC: 3.05 MIL/uL — ABNORMAL LOW (ref 3.87–5.11)
RDW: 15.6 % — ABNORMAL HIGH (ref 11.5–15.5)
WBC: 5.8 10*3/uL (ref 4.0–10.5)
nRBC: 0 % (ref 0.0–0.2)

## 2023-04-02 LAB — TROPONIN I (HIGH SENSITIVITY): Troponin I (High Sensitivity): 13 ng/L (ref <18)

## 2023-04-02 MED ORDER — HYDRALAZINE HCL 20 MG/ML IJ SOLN
10.0000 mg | Freq: Once | INTRAMUSCULAR | Status: AC
  Administered 2023-04-02: 10 mg via INTRAVENOUS
  Filled 2023-04-02: qty 1

## 2023-04-02 MED ORDER — OXYCODONE-ACETAMINOPHEN 5-325 MG PO TABS
1.0000 | ORAL_TABLET | Freq: Once | ORAL | Status: DC
  Filled 2023-04-02: qty 1

## 2023-04-02 NOTE — ED Triage Notes (Signed)
Pt BIBA from [facility]. C/o exacerbation of chronic L foot pain and swelling.  AOx4

## 2023-04-02 NOTE — ED Provider Notes (Signed)
I provided a substantive portion of the care of this patient.  I personally made/approved the management plan for this patient and take responsibility for the patient management.  EKG Interpretation  Date/Time:  Friday Apr 02 2023 13:05:43 EDT Ventricular Rate:  70 PR Interval:  182 QRS Duration: 98 QT Interval:  395 QTC Calculation: 427 R Axis:   -4 Text Interpretation: Sinus rhythm RSR' in V1 or V2, probably normal variant Confirmed by Lorre Nick ([id number]) on 04/02/2023 1:72:76 PM   89 year old female presents with left-sided ankle pain.  Patient hypertensive and does not want any medication for this.  Patient also defers any pain medication.  Will check labs and likely discharge.  Joint does not appear to be septic at this time.  X-ray of her ankle was without fracture   Lorre Nick, MD 04/02/23 1345

## 2023-04-02 NOTE — Discharge Instructions (Signed)
I maintain my assertion that Tylenol should not have any effect whatsoever on your kidneys that you can take up to 1000 mg every 6 hours, if you want to confirm this you can talk to your other doctors, there is no evidence that Tylenol is processing hemoperitoneum however it could help your pain.  Otherwise you can use your ankle brace, recommend rest, ice, compression, elevation, you can try topical Voltaren gel which should not have systemic absorption or long-term effects of your kidneys.  If your blood pressure remains poorly controlled despite taking your normal losartan you may want to talk to your primary care doctor about changing her blood pressure medication or increasing your dose.

## 2023-04-02 NOTE — ED Provider Notes (Signed)
Elaine Campbell AT [facility] Provider Note   CSN: [id number] Arrival date & time: 04/02/23  1156     History  Chief Complaint  Patient presents with   Leg Pain   Hypertension    Elaine Campbell is a 89 y.o. female with past medical history of arthritis, CKD stage three, hypertension who presents with concern for sudden onset worsening of left foot pain without known injury.  Patient reports that she has some chronic pain and swelling despite the that she tried to step on it this morning and it was excruciatingly painful compared to her baseline.  Patient has taken nothing for pain.  Patient reports that she cannot take Tylenol, ibuprofen, she does not want any narcotics. Patient with significantly elevated blood pressure on arrival.  She denies any chest pain, shortness of breath, vision changes, numbness, tingling, reports that she is on losartan, she takes it at night, she reports that she took her last dose as scheduled, she has never had her blood pressure elevated like this before.   Leg Pain Hypertension       Home Medications Prior to Admission medications   Not on File      Allergies    Amoxicillin    Review of Systems   Review of Systems  Musculoskeletal:  Positive for arthralgias.  All other systems reviewed and are negative.   Physical Exam Updated Vital Signs BP (!) 178/91   Pulse 77   Resp 15   Ht 5\' 3"  (1.6 m)   Wt 55.3 kg   SpO2 98%   BMI 21.61 kg/m  Physical Exam Vitals and nursing note reviewed.  Constitutional:      General: She is not in acute distress.    Appearance: Normal appearance.  HENT:     Head: Normocephalic and atraumatic.  Eyes:     General:        Right eye: No discharge.        Left eye: No discharge.  Cardiovascular:     Rate and Rhythm: Normal rate and regular rhythm.     Heart sounds: No murmur heard.    No friction rub. No gallop.  Pulmonary:     Effort: Pulmonary effort is normal.      Breath sounds: Normal breath sounds.  Abdominal:     General: Bowel sounds are normal.     Palpations: Abdomen is soft.  Musculoskeletal:     Comments: She does have swelling of bilateral lower extremities.  Left foot slightly greater than right.  No focal redness, no posterior calf tenderness, swelling.  Skin:    General: Skin is warm and dry.     Capillary Refill: Capillary refill takes less than 2 seconds.  Neurological:     Mental Status: She is alert and oriented to person, place, and time.  Psychiatric:        Mood and Affect: Mood normal.        Behavior: Behavior normal.     ED Results / Procedures / Treatments   Labs (all labs ordered are listed, but only abnormal results are displayed) Labs Reviewed  CBC - Abnormal; Notable for the following components:      Result Value   RBC 3.05 (*)    Hemoglobin 10.1 (*)    HCT 30.4 (*)    RDW 15.6 (*)    All other components within normal limits  BASIC METABOLIC PANEL - Abnormal; Notable for the following components:  Glucose, Bld 104 (*)    BUN 63 (*)    Creatinine, Ser 1.27 (*)    GFR, Estimated 40 (*)    All other components within normal limits  BRAIN NATRIURETIC PEPTIDE - Abnormal; Notable for the following components:   B Natriuretic Peptide 122.2 (*)    All other components within normal limits  TROPONIN I (HIGH SENSITIVITY)    EKG EKG Interpretation  Date/Time:  Friday Apr 02 2023 13:05:43 EDT Ventricular Rate:  70 PR Interval:  182 QRS Duration: 98 QT Interval:  395 QTC Calculation: 427 R Axis:   -4 Text Interpretation: Sinus rhythm RSR' in V1 or V2, probably normal variant Confirmed by Elaine Campbell ([id number]) on 04/02/2023 1:13:58 PM  Radiology DG Foot Complete Left  Result Date: 04/02/2023 CLINICAL DATA:  pain EXAM: LEFT FOOT - COMPLETE 3+ VIEW COMPARISON:  None Available. FINDINGS: No evidence of acute fracture or joint malalignment. Osteopenia. Osteoarthritis. Calcific atherosclerosis. IMPRESSION: No  evidence of acute fracture or joint malalignment. Electronically Signed   By: Elaine Harts M.D.   On: 04/02/2023 13:18    Procedures Procedures    Medications Ordered in ED Medications  oxyCODONE-acetaminophen (PERCOCET/ROXICET) 5-325 MG per tablet 1 tablet (1 tablet Oral Not Given 04/02/23 1255)  hydrALAZINE (APRESOLINE) injection 10 mg (10 mg Intravenous Given 04/02/23 1258)    ED Course/ Medical Decision Making/ A&P                             Medical Decision Making Amount and/or Complexity of Data Reviewed Labs: ordered. Radiology: ordered.  Risk Prescription drug management.   Medical Decision Making:   Elaine Campbell is a 89 y.o. female who presented to the ED today with hypertension despite taking blood pressure medication, as well as acute left foot pain without traumatic injury detailed above.    Additional history discussed with patient's family/caregivers.  Patient placed on continuous vitals and telemetry monitoring while in ED which was reviewed periodically.  Complete initial physical exam performed, notably the patient  was some tenderness to palpation of the left lower extremity, swelling in the bilateral lower extremities, she has what appears to be some chronic hallux valgus/other chronic degenerative/arthritic changes of the left lower foot.  Neurovascularly intact throughout on my exam, normal capillary refill of the affected extremity, no significant isolated swelling, redness to suggest acute gout.  She has no focal neurologic deficits, she was quite hypertensive on arrival with systolic of 220, diastolic max of 114.Elaine Campbell    Reviewed and confirmed nursing documentation for past medical history, family history, social history.    Initial Assessment:   With the patient's presentation of elevated blood pressure readings, most likely diagnosis is hypertensive urgency. Other diagnoses associated with hypertensive emergency were considered including (but not limited to)  intracranial hemorrhage, acute renal artery stenosis, acute kidney injury, myocardial stress, ophthalmologic emergencies. These are considered less likely due to history of present illness and physical exam findings.   This is most consistent with an acute life/limb threatening illness complicated by underlying chronic conditions. Will evaluate for hypertensive emergency as below. Initial Plan:  x-ray of left lower extremity,  Screening labs including CBC and Metabolic panel to evaluate for infectious or metabolic etiology of disease.  EKG to evaluate for cardiac pathology, normal sinus rhythm noted. Objective evaluation as below reviewed. Considered further administration of antihypertensives in ED. Will consider hydralazine x 1, patient with improvement of blood pressure prior to  discharge, 170/80 compared to her 227/99 at time of arrival.  Patient evaluated will monitor blood pressure while patient awaiting above laboratory studies.  Initial Study Results:   Laboratory  All laboratory results reviewed without evidence of clinically relevant pathology.   Exceptions include: Patient does have some UN,  Neither, she reports any disease and Given that she has CKD 3 at baseline and no evidence of acute change from reported baseline.  Her CBC is without significant leukocytosis, she has mild anemia, hemoglobin 10.9.  Troponin x 1 is within normal range, no active chest pain at this time.  She does have a mildly elevated BNP which could suggest beginning CHF, but no evidence of need for acute treatment at this time.  EKG EKG was reviewed independently. Rate, rhythm, axis, intervals all examined and without medically relevant abnormality. ST segments without concerns for elevations.    Radiology:  All images reviewed independently. Agree with radiology report at this time.   DG Foot Complete Left  Result Date: 04/02/2023 CLINICAL DATA:  pain EXAM: LEFT FOOT - COMPLETE 3+ VIEW COMPARISON:  None  Available. FINDINGS: No evidence of acute fracture or joint malalignment. Osteopenia. Osteoarthritis. Calcific atherosclerosis. IMPRESSION: No evidence of acute fracture or joint malalignment. Electronically Signed   By: Elaine Harts M.D.   On: 04/02/2023 13:18    Final Assessment and Plan:   Patient with hypertensive urgency but no evidence of hypertensive urgency, she does not want any other acute treatment after hydralazine x 1 today, but I encouraged her to follow-up with her PCP to discuss tighter blood pressure control if it remains elevated.  She declines all treatment for the foot pain, we discussed RICE, ASO brace, encouraged Voltaren gel, Tylenol, extensively educated patient that Tylenol is not processed through the kidneys.  Patient remains certain that Tylenol will cause her to have kidney injury, Voltaren gel has severe systemic absorption based on what her previous doctor had educated her on.  She declined muscle relaxant, narcotic pain control, discussed that otherwise she can try ice, elevation, compression as well as orthopedic follow-up.   Clinical Impression:  1. Hypertensive urgency   2. Acute left ankle pain      Discharge   Final Clinical Impression(s) / ED Diagnoses Final diagnoses:  Hypertensive urgency  Acute left ankle pain    Rx / DC Orders ED Discharge Orders     None         [place] 04/02/23 1451    Elaine Nick, MD 04/04/23 [phone]

## 2023-04-09 ENCOUNTER — Encounter: Payer: Self-pay | Admitting: Nurse Practitioner

## 2023-04-09 ENCOUNTER — Non-Acute Institutional Stay (SKILLED_NURSING_FACILITY): Payer: Medicare Other | Admitting: Nurse Practitioner

## 2023-04-09 DIAGNOSIS — M25572 Pain in left ankle and joints of left foot: Secondary | ICD-10-CM | POA: Diagnosis not present

## 2023-04-09 DIAGNOSIS — I10 Essential (primary) hypertension: Secondary | ICD-10-CM

## 2023-04-09 DIAGNOSIS — D649 Anemia, unspecified: Secondary | ICD-10-CM | POA: Diagnosis not present

## 2023-04-09 DIAGNOSIS — N1831 Chronic kidney disease, stage 3a: Secondary | ICD-10-CM | POA: Diagnosis not present

## 2023-04-09 DIAGNOSIS — N183 Chronic kidney disease, stage 3 unspecified: Secondary | ICD-10-CM | POA: Insufficient documentation

## 2023-04-09 DIAGNOSIS — N1832 Chronic kidney disease, stage 3b: Secondary | ICD-10-CM | POA: Insufficient documentation

## 2023-04-09 NOTE — Assessment & Plan Note (Signed)
Bun/creat 63/1.27 04/02/23

## 2023-04-09 NOTE — Assessment & Plan Note (Signed)
Hgb 10.1 04/02/23

## 2023-04-09 NOTE — Progress Notes (Signed)
Location:   [facility] Room Number: 2B Place of Service:  SNF (31) Provider:  Aadi Bordner X, NP  Eliezer Lofts, MD  Patient Care Team: Eliezer Lofts, MD as PCP - General (General Practice)  Extended Emergency Contact Information Primary Emergency Contact: Knott,[name] Mobile Phone: [phone] Relation: Other Secondary Emergency Contact: Mazeeva,Natalia Mobile Phone: [phone] Relation: Other  Code Status:  FULL CODE Goals of care: Advanced Directive information    04/09/2023   10:45 AM  Advanced Directives  Does Patient Have a Medical Advance Directive? No     Chief Complaint  Patient presents with   Acute Visit    Medication review    HPI:  Pt is a 89 y.o. female seen today for an acute visit for f/u ED evaluation 04/02/23 for hypertensive urgency, acute left ankle pain-Xray showed no acute fx.   HTN, started  Losartan 04/02/23  Hx of CKD Bun/creat 63/1.27 04/02/23  Anemia, Hgb 10.1 04/02/23   History reviewed. No pertinent past medical history. History reviewed. No pertinent surgical history.  Allergies  Allergen Reactions   Amoxicillin Nausea And Vomiting    Allergies as of 04/09/2023       Reactions   Amoxicillin Nausea And Vomiting        Medication List        Accurate as of Apr 09, 2023 11:59 PM. If you have any questions, ask your nurse or doctor.          losartan 25 MG tablet Commonly known as: COZAAR Take 25 mg by mouth daily.        Review of Systems  Constitutional:  Negative for appetite change, fatigue and fever.  HENT:  Negative for congestion and trouble swallowing.   Eyes:  Negative for visual disturbance.  Respiratory:  Negative for cough and shortness of breath.   Cardiovascular:  Positive for leg swelling. Negative for chest pain and palpitations.  Gastrointestinal:  Negative for abdominal pain, nausea and vomiting.  Genitourinary:  Negative for dysuria and urgency.  Musculoskeletal:   Positive for gait problem.  Skin:  Negative for color change.  Neurological:  Negative for speech difficulty, weakness and headaches.  Psychiatric/Behavioral:  Negative for confusion and sleep disturbance. The patient is not nervous/anxious.      There is no immunization history on file for this patient. Pertinent  Health Maintenance Due  Topic Date Due   INFLUENZA VACCINE  06/10/2023   DEXA SCAN  Completed       No data to display         Functional Status Survey:    Vitals:   04/09/23 1039 04/12/23 1103  BP: (!) 161/69 (!) 160/80  Pulse: 63   Resp: 18   Temp: 97.7 F (36.5 C)   SpO2: 97%   Weight: 123 lb (55.8 kg)   Height: 5\' 3"  (1.6 m)    Body mass index is 21.79 kg/m. Physical Exam Vitals and nursing note reviewed.  Constitutional:      Appearance: Normal appearance.  HENT:     Head: Normocephalic and atraumatic.     Nose: Nose normal.     Mouth/Throat:     Mouth: Mucous membranes are moist.  Eyes:     Extraocular Movements: Extraocular movements intact.     Conjunctiva/sclera: Conjunctivae normal.     Pupils: Pupils are equal, round, and reactive to light.  Cardiovascular:     Rate and Rhythm: Normal rate and regular rhythm.  Heart sounds: No murmur heard. Pulmonary:     Effort: Pulmonary effort is normal.     Breath sounds: No rales.  Abdominal:     General: Bowel sounds are normal.     Palpations: Abdomen is soft.     Tenderness: There is no abdominal tenderness.  Musculoskeletal:        General: Tenderness present.     Cervical back: Normal range of motion and neck supple.     Right lower leg: Edema present.     Left lower leg: Edema present.     Comments: 1+ edema BLE, mostly in ankles/feet. Left ankle tenderness, hard to bear weight unless standing up straight, no redness or warmth noted  Skin:    General: Skin is warm and dry.  Neurological:     General: No focal deficit present.     Mental Status: She is alert and oriented to person,  place, and time. Mental status is at baseline.     Motor: No weakness.     Gait: Gait abnormal.  Psychiatric:        Mood and Affect: Mood normal.        Behavior: Behavior normal.        Thought Content: Thought content normal.        Judgment: Judgment normal.     Labs reviewed: Recent Labs    04/02/23 1242  NA 137  K 4.1  CL 105  CO2 23  GLUCOSE 104*  BUN 63*  CREATININE 1.27*  CALCIUM 9.1   No results for input(s): "AST", "ALT", "ALKPHOS", "BILITOT", "PROT", "ALBUMIN" in the last 8760 hours. Recent Labs    04/02/23 1242  WBC 5.8  HGB 10.1*  HCT 30.4*  MCV 99.7  PLT 217   No results found for: "TSH" No results found for: "HGBA1C" No results found for: "CHOL", "HDL", "LDLCALC", "LDLDIRECT", "TRIG", "CHOLHDL"  Significant Diagnostic Results in last 30 days:  DG Foot Complete Left  Result Date: 04/02/2023 CLINICAL DATA:  pain EXAM: LEFT FOOT - COMPLETE 3+ VIEW COMPARISON:  None Available. FINDINGS: No evidence of acute fracture or joint malalignment. Osteopenia. Osteoarthritis. Calcific atherosclerosis. IMPRESSION: No evidence of acute fracture or joint malalignment. Electronically Signed   By: Feliberto Harts M.D.   On: 04/02/2023 13:18    Assessment/Plan HTN (hypertension) Blood pressure is not well controlled, will increase Losartan to 50mg  qd. Update CBC/diff, CMP/eGFR, TSH, lipid panel, Vit B12, Vit D, Hgb A1c  CKD (chronic kidney disease) stage 3, GFR 30-59 ml/min (HCC) Bun/creat 63/1.27 04/02/23  Anemia Hgb 10.1 04/02/23  Left ankle pain Xray 04/02/23 in ED negative for fx, will repeat X ray left ankle 3 views, CRP, uric acid, ESR.  May consider Voltaren gel qid. Update DEXA 04/09/23 X-ray L ankle, arthritis.      Family/ staff Communication: plan of care reviewed with the patient and charge nurse.   Labs/tests ordered:  CBC/diff, CMP/eGFR, uric acid, ESR, CRP, TSH, lipid panel, Vit B12, Vit D, Hgb A1c, Xray left ankle 3 views. DEXA  Time spend 35  minutes.

## 2023-04-09 NOTE — Assessment & Plan Note (Addendum)
Xray 04/02/23 in ED negative for fx, will repeat X ray left ankle 3 views, CRP, uric acid, ESR.  May consider Voltaren gel qid. Update DEXA 04/09/23 X-ray L ankle, arthritis.

## 2023-04-09 NOTE — Assessment & Plan Note (Signed)
Blood pressure is not well controlled, will increase Losartan to 50mg  qd. Update CBC/diff, CMP/eGFR, TSH, lipid panel, Vit B12, Vit D, Hgb A1c

## 2023-04-12 ENCOUNTER — Encounter: Payer: Self-pay | Admitting: Nurse Practitioner

## 2023-04-13 ENCOUNTER — Non-Acute Institutional Stay (SKILLED_NURSING_FACILITY): Payer: Medicare Other | Admitting: Family Medicine

## 2023-04-13 DIAGNOSIS — I10 Essential (primary) hypertension: Secondary | ICD-10-CM | POA: Diagnosis not present

## 2023-04-13 DIAGNOSIS — M25572 Pain in left ankle and joints of left foot: Secondary | ICD-10-CM

## 2023-04-13 DIAGNOSIS — N1831 Chronic kidney disease, stage 3a: Secondary | ICD-10-CM | POA: Diagnosis not present

## 2023-04-13 NOTE — Progress Notes (Signed)
Provider:  Jacalyn Lefevre, MD Location:      Place of Service:     PCP: Eliezer Lofts, MD Patient Care Team: Eliezer Lofts, MD as PCP - General (General Practice)  Extended Emergency Contact Information Primary Emergency Contact: Knott,[name] Mobile Phone: [phone] Relation: Other Secondary Emergency Contact: Mazeeva,Natalia Mobile Phone: [phone] Relation: Other  Code Status:  Goals of Care: Advanced Directive information    04/09/2023   10:45 AM  Advanced Directives  Does Patient Have a Medical Advance Directive? No      No chief complaint on file.   HPI: Patient is a 89 y.o. female seen today for admission to [facility].  Patient was sent to the emergency room on 524 with acute left ankle pain and inability to stand or walk.  There had been no injury.  Patient had no evidence of acute fracture or joint malalignment.  She was sent back to assisted living caregivers in assisted living thought she needed more care than she could receive there and she was transferred to skilled care for further rehab. She also has issues with elevated blood pressure.  Losartan was started on 04/02/2023.  Pressures have varied with typical diurnal variation  No past medical history on file. No past surgical history on file.  has no history on file for tobacco use, alcohol use, and drug use. Social History   Socioeconomic History   Marital status: Widowed    Spouse name: Not on file   Number of children: Not on file   Years of education: Not on file   Highest education level: Not on file  Occupational History   Not on file  Tobacco Use   Smoking status: Not on file   Smokeless tobacco: Not on file  Substance and Sexual Activity   Alcohol use: Not on file   Drug use: Not on file   Sexual activity: Not on file  Other Topics Concern   Not on file  Social History Narrative   Not on file   Social Determinants of Health   Financial Resource Strain: Not  on file  Food Insecurity: Not on file  Transportation Needs: Not on file  Physical Activity: Not on file  Stress: Not on file  Social Connections: Not on file  Intimate Partner Violence: Not on file    Functional Status Survey:    No family history on file.  Health Maintenance  Topic Date Due   Medicare Annual Wellness (AWV)  Never done   COVID-19 Vaccine (1) Never done   DTaP/Tdap/Td (1 - Tdap) Never done   Zoster Vaccines- Shingrix (1 of 2) Never done   Pneumonia Vaccine 88+ Years old (1 of 1 - PCV) Never done   INFLUENZA VACCINE  06/10/2023   DEXA SCAN  Completed   HPV VACCINES  Aged Out    Allergies  Allergen Reactions   Amoxicillin Nausea And Vomiting    Outpatient Encounter Medications as of 04/13/2023  Medication Sig   losartan (COZAAR) 25 MG tablet Take 25 mg by mouth daily.   No facility-administered encounter medications on file as of 04/13/2023.    Review of Systems  Constitutional:  Positive for activity change.  HENT: Negative.    Respiratory: Negative.    Cardiovascular: Negative.   Gastrointestinal: Negative.   Genitourinary: Negative.   Musculoskeletal:  Positive for arthralgias and gait problem.  Psychiatric/Behavioral: Negative.    All other systems reviewed and are negative.   There were no vitals filed  for this visit. There is no height or weight on file to calculate BMI. Physical Exam Vitals and nursing note reviewed.  Constitutional:      Appearance: Normal appearance.  Cardiovascular:     Rate and Rhythm: Normal rate and regular rhythm.  Pulmonary:     Effort: Pulmonary effort is normal.     Breath sounds: Normal breath sounds.  Neurological:     General: No focal deficit present.     Mental Status: She is alert and oriented to person, place, and time.     Labs reviewed: Basic Metabolic Panel: Recent Labs    04/02/23 1242  NA 137  K 4.1  CL 105  CO2 23  GLUCOSE 104*  BUN 63*  CREATININE 1.27*  CALCIUM 9.1   Liver  Function Tests: No results for input(s): "AST", "ALT", "ALKPHOS", "BILITOT", "PROT", "ALBUMIN" in the last 8760 hours. No results for input(s): "LIPASE", "AMYLASE" in the last 8760 hours. No results for input(s): "AMMONIA" in the last 8760 hours. CBC: Recent Labs    04/02/23 1242  WBC 5.8  HGB 10.1*  HCT 30.4*  MCV 99.7  PLT 217   Cardiac Enzymes: No results for input(s): "CKTOTAL", "CKMB", "CKMBINDEX", "TROPONINI" in the last 8760 hours. BNP: Invalid input(s): "POCBNP" No results found for: "HGBA1C" No results found for: "TSH" No results found for: "VITAMINB12" No results found for: "FOLATE" No results found for: "IRON", "TIBC", "FERRITIN"  Imaging and Procedures obtained prior to SNF admission: DG Foot Complete Left  Result Date: 04/02/2023 CLINICAL DATA:  pain EXAM: LEFT FOOT - COMPLETE 3+ VIEW COMPARISON:  None Available. FINDINGS: No evidence of acute fracture or joint malalignment. Osteopenia. Osteoarthritis. Calcific atherosclerosis. IMPRESSION: No evidence of acute fracture or joint malalignment. Electronically Signed   By: Feliberto Harts M.D.   On: 04/02/2023 13:18    Assessment/Plan 1. Stage 3a chronic kidney disease (HCC) Patient is convinced that she can control her renal disease with diet and I think her blood pressure probably has a bigger bearing and demands better control  2. Primary hypertension Losartan was started.  Since it is not truly a 24-hour medicine I will divide the doses and continue to monitor her pressure  3. Acute left ankle pain Patient is receiving therapy.  Anticipate short stay.  She does use a walker for help in ambulation    Family/ staff Communication:   Labs/tests ordered:  Bertram Millard. Hyacinth Meeker, MD [facility] [address] [place], [place] [address] Office [phone]

## 2023-04-20 ENCOUNTER — Non-Acute Institutional Stay (SKILLED_NURSING_FACILITY): Payer: Medicare Other | Admitting: Nurse Practitioner

## 2023-04-20 ENCOUNTER — Encounter: Payer: Self-pay | Admitting: Nurse Practitioner

## 2023-04-20 DIAGNOSIS — R600 Localized edema: Secondary | ICD-10-CM

## 2023-04-20 DIAGNOSIS — R269 Unspecified abnormalities of gait and mobility: Secondary | ICD-10-CM

## 2023-04-20 DIAGNOSIS — M25572 Pain in left ankle and joints of left foot: Secondary | ICD-10-CM

## 2023-04-20 DIAGNOSIS — I10 Essential (primary) hypertension: Secondary | ICD-10-CM | POA: Diagnosis not present

## 2023-04-20 DIAGNOSIS — N1831 Chronic kidney disease, stage 3a: Secondary | ICD-10-CM | POA: Diagnosis not present

## 2023-04-20 DIAGNOSIS — D649 Anemia, unspecified: Secondary | ICD-10-CM

## 2023-04-20 MED ORDER — LOSARTAN POTASSIUM 50 MG PO TABS
50.0000 mg | ORAL_TABLET | Freq: Every day | ORAL | 1 refills | Status: DC
Start: 2023-04-20 — End: 2023-09-09

## 2023-04-20 NOTE — Assessment & Plan Note (Addendum)
Bun/creat 45/1.38, A1c 5.7 04/13/23

## 2023-04-20 NOTE — Assessment & Plan Note (Signed)
OA left ankle pain, repeated X-ray 04/09/23 SNF [facility] showed arthritis, Voltaren topical helps, unremarkable ESR, CRP, uric acid.

## 2023-04-20 NOTE — Assessment & Plan Note (Addendum)
started  Losartan 04/02/23, titrated up to 50mg , blood pressure is controlled. LDL 91 04/13/23

## 2023-04-20 NOTE — Progress Notes (Signed)
Location:   [facility]NF [facility] Room Number: NO/02/B Place of Service:  SNF (31)  Provider: Chipper Oman NP  PCP: Eliezer Lofts, MD Patient Care Team: Eliezer Lofts, MD as PCP - General (General Practice)  Extended Emergency Contact Information Primary Emergency Contact: Knott,[name] Mobile Phone: [phone] Relation: Other Secondary Emergency Contact: Mazeeva,Natalia Mobile Phone: [phone] Relation: Other  Code Status: DNR Goals of care:  Advanced Directive information    04/20/2023   10:04 AM  Advanced Directives  Does Patient Have a Medical Advance Directive? No  Type of Advance Directive Healthcare Power of Attorney  Would patient like information on creating a medical advance directive? No - Patient declined     Allergies  Allergen Reactions   Amoxicillin Nausea And Vomiting    Chief Complaint  Patient presents with   Acute Visit    Patient is being discharged today Patient is also due for her AWV    HPI:  89 y.o. female with medical history significant of HTN, CKD stage 3, left ankle pain, gait abnormality admitted to SNF [facility] for pain management, blood pressure control, therapy for gait abnormality.   The patient has regained her physical strength, mobility, better blood pressure control, and ADL function. The patient is stable to return [place] [facility] she resided prior for continuation of therapy.  ED evaluation 04/02/23 for hypertensive urgency, acute left ankle pain-Xray showed no acute fx.  OA left ankle pain, repeated X-ray 04/09/23 SNF [facility] showed arthritis, Voltaren topical helps, unremarkable ESR, CRP, uric acid(7.3).  Gait abnormality, since left ankle pain, ambulates with walker, motorized w/c to go further.              HTN, started  Losartan 04/02/23, titrated up to 50mg , blood pressure is controlled. LDL 91 04/13/23             Hx of CKD Bun/creat 45/1.38, A1c 5.7 04/13/23             Anemia, Hgb 10.1 04/02/23, 9.5, Vit B12 1006, TSH 1.94  04/13/23  Edema, chronic, mild.    History reviewed. No pertinent past medical history.  History reviewed. No pertinent surgical history.    reports that she has never smoked. She has never used smokeless tobacco. She reports that she does not currently use alcohol. She reports that she does not use drugs. Social History   Socioeconomic History   Marital status: Widowed    Spouse name: Not on file   Number of children: Not on file   Years of education: Not on file   Highest education level: Not on file  Occupational History   Not on file  Tobacco Use   Smoking status: Never   Smokeless tobacco: Never  Substance and Sexual Activity   Alcohol use: Not Currently   Drug use: Never   Sexual activity: Not Currently  Other Topics Concern   Not on file  Social History Narrative   Not on file   Social Determinants of Health   Financial Resource Strain: Not on file  Food Insecurity: Not on file  Transportation Needs: Not on file  Physical Activity: Not on file  Stress: Not on file  Social Connections: Not on file  Intimate Partner Violence: Not on file   Functional Status Survey:    Allergies  Allergen Reactions   Amoxicillin Nausea And Vomiting    Pertinent  Health Maintenance Due  Topic Date Due   INFLUENZA VACCINE  06/10/2023   DEXA SCAN  Completed  Medications: Allergies as of 04/20/2023       Reactions   Amoxicillin Nausea And Vomiting        Medication List        Accurate as of April 20, 2023  1:18 PM. If you have any questions, ask your nurse or doctor.          losartan 50 MG tablet Commonly known as: COZAAR Take 1 tablet (50 mg total) by mouth daily. What changed:  medication strength how much to take Changed by: Nakai Pollio X Kenric Ginger, NP        Review of Systems  Constitutional:  Negative for appetite change, fatigue and fever.  HENT:  Negative for congestion and trouble swallowing.   Eyes:  Negative for visual disturbance.  Respiratory:   Negative for cough and shortness of breath.   Cardiovascular:  Positive for leg swelling. Negative for chest pain and palpitations.  Gastrointestinal:  Negative for abdominal pain, nausea and vomiting.  Genitourinary:  Negative for dysuria and urgency.  Musculoskeletal:  Positive for gait problem.  Skin:  Negative for color change.  Neurological:  Negative for speech difficulty, weakness and headaches.  Psychiatric/Behavioral:  Negative for confusion and sleep disturbance. The patient is not nervous/anxious.     Vitals:   04/20/23 1001  BP: 134/81  Pulse: 65  Resp: 17  Temp: (!) 96.9 F (36.1 C)  SpO2: 95%  Weight: 120 lb 3.2 oz (54.5 kg)  Height: 5\' 3"  (1.6 m)   Body mass index is 21.29 kg/m. Physical Exam Vitals and nursing note reviewed.  Constitutional:      Appearance: Normal appearance.  HENT:     Head: Normocephalic and atraumatic.     Nose: Nose normal.     Mouth/Throat:     Mouth: Mucous membranes are moist.  Eyes:     Extraocular Movements: Extraocular movements intact.     Conjunctiva/sclera: Conjunctivae normal.     Pupils: Pupils are equal, round, and reactive to light.  Cardiovascular:     Rate and Rhythm: Normal rate and regular rhythm.     Heart sounds: No murmur heard. Pulmonary:     Effort: Pulmonary effort is normal.     Breath sounds: No rales.  Abdominal:     General: Bowel sounds are normal.     Palpations: Abdomen is soft.     Tenderness: There is no abdominal tenderness.  Musculoskeletal:        General: Tenderness present.     Cervical back: Normal range of motion and neck supple.     Right lower leg: Edema present.     Left lower leg: Edema present.     Comments: 1+ edema BLE, mostly in ankles/feet. Left ankle tenderness, hard to bear weight unless standing up straight, no redness or warmth noted  Skin:    General: Skin is warm and dry.  Neurological:     General: No focal deficit present.     Mental Status: She is alert and oriented  to person, place, and time. Mental status is at baseline.     Motor: No weakness.     Gait: Gait abnormal.  Psychiatric:        Mood and Affect: Mood normal.        Behavior: Behavior normal.        Thought Content: Thought content normal.        Judgment: Judgment normal.     Labs reviewed: Basic Metabolic Panel: Recent Labs    04/02/23  1242  NA 137  K 4.1  CL 105  CO2 23  GLUCOSE 104*  BUN 63*  CREATININE 1.27*  CALCIUM 9.1   Liver Function Tests: No results for input(s): "AST", "ALT", "ALKPHOS", "BILITOT", "PROT", "ALBUMIN" in the last 8760 hours. No results for input(s): "LIPASE", "AMYLASE" in the last 8760 hours. No results for input(s): "AMMONIA" in the last 8760 hours. CBC: Recent Labs    04/02/23 1242  WBC 5.8  HGB 10.1*  HCT 30.4*  MCV 99.7  PLT 217   Cardiac Enzymes: No results for input(s): "CKTOTAL", "CKMB", "CKMBINDEX", "TROPONINI" in the last 8760 hours. BNP: Invalid input(s): "POCBNP" CBG: No results for input(s): "GLUCAP" in the last 8760 hours.  Procedures and Imaging Studies During Stay: DG Foot Complete Left  Result Date: 04/02/2023 CLINICAL DATA:  pain EXAM: LEFT FOOT - COMPLETE 3+ VIEW COMPARISON:  None Available. FINDINGS: No evidence of acute fracture or joint malalignment. Osteopenia. Osteoarthritis. Calcific atherosclerosis. IMPRESSION: No evidence of acute fracture or joint malalignment. Electronically Signed   By: Feliberto Harts M.D.   On: 04/02/2023 13:18    Assessment/Plan:   Gait abnormality  since left ankle pain, ambulates with walker, motorized w/c to go further.   HTN (hypertension)  started  Losartan 04/02/23, titrated up to 50mg , blood pressure is controlled. LDL 91 04/13/23  Left ankle pain OA left ankle pain, repeated X-ray 04/09/23 SNF [facility] showed arthritis, Voltaren topical helps, unremarkable ESR, CRP, uric acid.   CKD (chronic kidney disease) stage 3, GFR 30-59 ml/min (HCC) Bun/creat 45/1.38, A1c 5.7  04/13/23  Anemia  Hgb 10.1 04/02/23, 9.5, Vit B12 1006, TSH 1.94 04/13/23  Peripheral edema Chronic, mild, BLE>    Patient is being discharged with the following home health services:    Patient is being discharged with the following durable medical equipment:    Patient has been advised to f/u with their PCP in 1-2 weeks to bring them up to date on their rehab stay.  Social services at facility was responsible for arranging this appointment.  Pt was provided with a 30 day supply of prescriptions for medications and refills must be obtained from their PCP.  For controlled substances, a more limited supply may be provided adequate until PCP appointment only.  Future labs/tests needed: prn

## 2023-04-20 NOTE — Assessment & Plan Note (Signed)
Chronic, mild, BLE>

## 2023-04-20 NOTE — Assessment & Plan Note (Addendum)
Hgb 10.1 04/02/23, 9.5, Vit B12 1006, TSH 1.94 04/13/23

## 2023-04-20 NOTE — Assessment & Plan Note (Signed)
since left ankle pain, ambulates with walker, motorized w/c to go further.

## 2023-06-18 ENCOUNTER — Encounter: Payer: Self-pay | Admitting: Family Medicine

## 2023-06-21 ENCOUNTER — Encounter: Payer: Self-pay | Admitting: Family Medicine

## 2023-09-05 ENCOUNTER — Encounter (HOSPITAL_BASED_OUTPATIENT_CLINIC_OR_DEPARTMENT_OTHER): Payer: Self-pay

## 2023-09-05 ENCOUNTER — Inpatient Hospital Stay (HOSPITAL_BASED_OUTPATIENT_CLINIC_OR_DEPARTMENT_OTHER)
Admission: EM | Admit: 2023-09-05 | Discharge: 2023-09-09 | DRG: 871 | Disposition: A | Discharge status: Hospice | Payer: Medicare Other | Attending: Internal Medicine | Admitting: Internal Medicine

## 2023-09-05 ENCOUNTER — Emergency Department (HOSPITAL_BASED_OUTPATIENT_CLINIC_OR_DEPARTMENT_OTHER): Payer: Medicare Other

## 2023-09-05 ENCOUNTER — Other Ambulatory Visit: Payer: Self-pay

## 2023-09-05 DIAGNOSIS — D649 Anemia, unspecified: Secondary | ICD-10-CM | POA: Diagnosis present

## 2023-09-05 DIAGNOSIS — G8929 Other chronic pain: Secondary | ICD-10-CM | POA: Diagnosis present

## 2023-09-05 DIAGNOSIS — Z7401 Bed confinement status: Secondary | ICD-10-CM

## 2023-09-05 DIAGNOSIS — E860 Dehydration: Secondary | ICD-10-CM | POA: Diagnosis present

## 2023-09-05 DIAGNOSIS — D631 Anemia in chronic kidney disease: Secondary | ICD-10-CM | POA: Diagnosis present

## 2023-09-05 DIAGNOSIS — R195 Other fecal abnormalities: Secondary | ICD-10-CM | POA: Diagnosis present

## 2023-09-05 DIAGNOSIS — N1831 Chronic kidney disease, stage 3a: Secondary | ICD-10-CM | POA: Diagnosis not present

## 2023-09-05 DIAGNOSIS — N170 Acute kidney failure with tubular necrosis: Secondary | ICD-10-CM | POA: Diagnosis present

## 2023-09-05 DIAGNOSIS — E871 Hypo-osmolality and hyponatremia: Secondary | ICD-10-CM | POA: Diagnosis present

## 2023-09-05 DIAGNOSIS — N390 Urinary tract infection, site not specified: Secondary | ICD-10-CM | POA: Diagnosis present

## 2023-09-05 DIAGNOSIS — N1832 Chronic kidney disease, stage 3b: Secondary | ICD-10-CM | POA: Diagnosis present

## 2023-09-05 DIAGNOSIS — E875 Hyperkalemia: Secondary | ICD-10-CM | POA: Diagnosis present

## 2023-09-05 DIAGNOSIS — R54 Age-related physical debility: Secondary | ICD-10-CM | POA: Diagnosis present

## 2023-09-05 DIAGNOSIS — Z515 Encounter for palliative care: Secondary | ICD-10-CM | POA: Diagnosis not present

## 2023-09-05 DIAGNOSIS — Z1152 Encounter for screening for COVID-19: Secondary | ICD-10-CM | POA: Diagnosis not present

## 2023-09-05 DIAGNOSIS — N3000 Acute cystitis without hematuria: Secondary | ICD-10-CM

## 2023-09-05 DIAGNOSIS — M549 Dorsalgia, unspecified: Secondary | ICD-10-CM | POA: Diagnosis present

## 2023-09-05 DIAGNOSIS — G9341 Metabolic encephalopathy: Secondary | ICD-10-CM

## 2023-09-05 DIAGNOSIS — M25562 Pain in left knee: Secondary | ICD-10-CM | POA: Diagnosis present

## 2023-09-05 DIAGNOSIS — Z91041 Radiographic dye allergy status: Secondary | ICD-10-CM | POA: Diagnosis not present

## 2023-09-05 DIAGNOSIS — N179 Acute kidney failure, unspecified: Secondary | ICD-10-CM | POA: Diagnosis present

## 2023-09-05 DIAGNOSIS — E872 Acidosis, unspecified: Secondary | ICD-10-CM | POA: Diagnosis present

## 2023-09-05 DIAGNOSIS — Z66 Do not resuscitate: Secondary | ICD-10-CM | POA: Diagnosis present

## 2023-09-05 DIAGNOSIS — A4151 Sepsis due to Escherichia coli [E. coli]: Principal | ICD-10-CM | POA: Diagnosis present

## 2023-09-05 DIAGNOSIS — R5383 Other fatigue: Principal | ICD-10-CM

## 2023-09-05 DIAGNOSIS — R6520 Severe sepsis without septic shock: Secondary | ICD-10-CM | POA: Diagnosis present

## 2023-09-05 DIAGNOSIS — Z79899 Other long term (current) drug therapy: Secondary | ICD-10-CM | POA: Diagnosis not present

## 2023-09-05 DIAGNOSIS — M25561 Pain in right knee: Secondary | ICD-10-CM | POA: Diagnosis present

## 2023-09-05 DIAGNOSIS — I129 Hypertensive chronic kidney disease with stage 1 through stage 4 chronic kidney disease, or unspecified chronic kidney disease: Secondary | ICD-10-CM | POA: Diagnosis present

## 2023-09-05 DIAGNOSIS — A419 Sepsis, unspecified organism: Secondary | ICD-10-CM | POA: Diagnosis not present

## 2023-09-05 DIAGNOSIS — N183 Chronic kidney disease, stage 3 unspecified: Secondary | ICD-10-CM | POA: Diagnosis present

## 2023-09-05 DIAGNOSIS — T68XXXA Hypothermia, initial encounter: Secondary | ICD-10-CM | POA: Diagnosis not present

## 2023-09-05 DIAGNOSIS — Z880 Allergy status to penicillin: Secondary | ICD-10-CM

## 2023-09-05 DIAGNOSIS — I10 Essential (primary) hypertension: Secondary | ICD-10-CM | POA: Diagnosis present

## 2023-09-05 DIAGNOSIS — R680 Hypothermia, not associated with low environmental temperature: Secondary | ICD-10-CM | POA: Diagnosis present

## 2023-09-05 DIAGNOSIS — R011 Cardiac murmur, unspecified: Secondary | ICD-10-CM | POA: Diagnosis not present

## 2023-09-05 DIAGNOSIS — Z7189 Other specified counseling: Secondary | ICD-10-CM | POA: Diagnosis not present

## 2023-09-05 LAB — BASIC METABOLIC PANEL
Anion gap: 9 (ref 5–15)
BUN: 110 mg/dL — ABNORMAL HIGH (ref 8–23)
CO2: 11 mmol/L — ABNORMAL LOW (ref 22–32)
Calcium: 8.1 mg/dL — ABNORMAL LOW (ref 8.9–10.3)
Chloride: 112 mmol/L — ABNORMAL HIGH (ref 98–111)
Creatinine, Ser: 2.02 mg/dL — ABNORMAL HIGH (ref 0.44–1.00)
GFR, Estimated: 23 mL/min — ABNORMAL LOW (ref >60)
Glucose, Bld: 86 mg/dL (ref 70–99)
Potassium: 5.3 mmol/L — ABNORMAL HIGH (ref 3.5–5.1)
Sodium: 132 mmol/L — ABNORMAL LOW (ref 135–145)

## 2023-09-05 LAB — MAGNESIUM: Magnesium: 2.1 mg/dL (ref 1.7–2.4)

## 2023-09-05 LAB — CBC WITH DIFFERENTIAL/PLATELET
Abs Immature Granulocytes: 0.02 10*3/uL (ref 0.00–0.07)
Basophils Absolute: 0 10*3/uL (ref 0.0–0.1)
Basophils Relative: 0 %
Eosinophils Absolute: 0 10*3/uL (ref 0.0–0.5)
Eosinophils Relative: 0 %
HCT: 27.3 % — ABNORMAL LOW (ref 36.0–46.0)
Hemoglobin: 9.4 g/dL — ABNORMAL LOW (ref 12.0–15.0)
Immature Granulocytes: 0 %
Lymphocytes Relative: 4 %
Lymphs Abs: 0.4 10*3/uL — ABNORMAL LOW (ref 0.7–4.0)
MCH: 31.9 pg (ref 26.0–34.0)
MCHC: 34.4 g/dL (ref 30.0–36.0)
MCV: 92.5 fL (ref 80.0–100.0)
Monocytes Absolute: 1 10*3/uL (ref 0.1–1.0)
Monocytes Relative: 11 %
Neutro Abs: 8 10*3/uL — ABNORMAL HIGH (ref 1.7–7.7)
Neutrophils Relative %: 85 %
Platelets: 162 10*3/uL (ref 150–400)
RBC: 2.95 MIL/uL — ABNORMAL LOW (ref 3.87–5.11)
RDW: 17.1 % — ABNORMAL HIGH (ref 11.5–15.5)
WBC: 9.4 10*3/uL (ref 4.0–10.5)
nRBC: 0 % (ref 0.0–0.2)

## 2023-09-05 LAB — BLOOD GAS, VENOUS
Acid-base deficit: 16.4 mmol/L — ABNORMAL HIGH (ref 0.0–2.0)
Bicarbonate: 11 mmol/L — ABNORMAL LOW (ref 20.0–28.0)
O2 Saturation: 98.1 %
Patient temperature: 36.1
pCO2, Ven: 30 mm[Hg] — ABNORMAL LOW (ref 44–60)
pH, Ven: 7.17 — CL (ref 7.25–7.43)
pO2, Ven: 148 mm[Hg] — ABNORMAL HIGH (ref 32–45)

## 2023-09-05 LAB — URINALYSIS, ROUTINE W REFLEX MICROSCOPIC
Bilirubin Urine: NEGATIVE
Glucose, UA: NEGATIVE mg/dL
Hgb urine dipstick: NEGATIVE
Ketones, ur: NEGATIVE mg/dL
Nitrite: POSITIVE — AB
Protein, ur: NEGATIVE mg/dL
Specific Gravity, Urine: 1.01 (ref 1.005–1.030)
pH: 5.5 (ref 5.0–8.0)

## 2023-09-05 LAB — CBC
HCT: 27.4 % — ABNORMAL LOW (ref 36.0–46.0)
Hemoglobin: 8.8 g/dL — ABNORMAL LOW (ref 12.0–15.0)
MCH: 32.4 pg (ref 26.0–34.0)
MCHC: 32.1 g/dL (ref 30.0–36.0)
MCV: 100.7 fL — ABNORMAL HIGH (ref 80.0–100.0)
Platelets: 156 10*3/uL (ref 150–400)
RBC: 2.72 MIL/uL — ABNORMAL LOW (ref 3.87–5.11)
RDW: 17.4 % — ABNORMAL HIGH (ref 11.5–15.5)
WBC: 8.1 10*3/uL (ref 4.0–10.5)
nRBC: 0 % (ref 0.0–0.2)

## 2023-09-05 LAB — VITAMIN B12: Vitamin B-12: 1073 pg/mL — ABNORMAL HIGH (ref 180–914)

## 2023-09-05 LAB — COMPREHENSIVE METABOLIC PANEL
ALT: 27 U/L (ref 0–44)
AST: 23 U/L (ref 15–41)
Albumin: 3 g/dL — ABNORMAL LOW (ref 3.5–5.0)
Alkaline Phosphatase: 52 U/L (ref 38–126)
Anion gap: 11 (ref 5–15)
BUN: 129 mg/dL — ABNORMAL HIGH (ref 8–23)
CO2: 13 mmol/L — ABNORMAL LOW (ref 22–32)
Calcium: 9.1 mg/dL (ref 8.9–10.3)
Chloride: 106 mmol/L (ref 98–111)
Creatinine, Ser: 2.22 mg/dL — ABNORMAL HIGH (ref 0.44–1.00)
GFR, Estimated: 21 mL/min — ABNORMAL LOW (ref >60)
Glucose, Bld: 109 mg/dL — ABNORMAL HIGH (ref 70–99)
Potassium: 5 mmol/L (ref 3.5–5.1)
Sodium: 130 mmol/L — ABNORMAL LOW (ref 135–145)
Total Bilirubin: 0.2 mg/dL — ABNORMAL LOW (ref 0.3–1.2)
Total Protein: 5.5 g/dL — ABNORMAL LOW (ref 6.5–8.1)

## 2023-09-05 LAB — RETICULOCYTES
Immature Retic Fract: 19.2 % — ABNORMAL HIGH (ref 2.3–15.9)
RBC.: 2.68 MIL/uL — ABNORMAL LOW (ref 3.87–5.11)
Retic Count, Absolute: 43.4 10*3/uL (ref 19.0–186.0)
Retic Ct Pct: 1.6 % (ref 0.4–3.1)

## 2023-09-05 LAB — IRON AND TIBC
Iron: 55 ug/dL (ref 28–170)
Saturation Ratios: 21 % (ref 10.4–31.8)
TIBC: 259 ug/dL (ref 250–450)
UIBC: 204 ug/dL

## 2023-09-05 LAB — SODIUM, URINE, RANDOM: Sodium, Ur: 20 mmol/L

## 2023-09-05 LAB — FOLATE: Folate: 15.8 ng/mL (ref >5.9)

## 2023-09-05 LAB — PHOSPHORUS: Phosphorus: 4.5 mg/dL (ref 2.5–4.6)

## 2023-09-05 LAB — CK: Total CK: 102 U/L (ref 38–234)

## 2023-09-05 LAB — CREATININE, URINE, RANDOM: Creatinine, Urine: 34 mg/dL

## 2023-09-05 LAB — AMMONIA: Ammonia: 20 umol/L (ref 9–35)

## 2023-09-05 LAB — TROPONIN I (HIGH SENSITIVITY): Troponin I (High Sensitivity): 9 ng/L (ref <18)

## 2023-09-05 LAB — OSMOLALITY: Osmolality: 318 mosm/kg — ABNORMAL HIGH (ref 275–295)

## 2023-09-05 LAB — GLUCOSE, CAPILLARY: Glucose-Capillary: 78 mg/dL (ref 70–99)

## 2023-09-05 LAB — ABO/RH: ABO/RH(D): O NEG

## 2023-09-05 LAB — SALICYLATE LEVEL: Salicylate Lvl: <7 mg/dL — ABNORMAL LOW (ref 7.0–30.0)

## 2023-09-05 LAB — OCCULT BLOOD X 1 CARD TO LAB, STOOL: Fecal Occult Bld: POSITIVE — AB

## 2023-09-05 LAB — TSH: TSH: 3.388 u[IU]/mL (ref 0.350–4.500)

## 2023-09-05 LAB — LIPASE, BLOOD: Lipase: 69 U/L — ABNORMAL HIGH (ref 11–51)

## 2023-09-05 LAB — LACTIC ACID, PLASMA: Lactic Acid, Venous: 0.4 mmol/L — ABNORMAL LOW (ref 0.5–1.9)

## 2023-09-05 LAB — FERRITIN: Ferritin: 237 ng/mL (ref 11–307)

## 2023-09-05 MED ORDER — PANTOPRAZOLE SODIUM 40 MG IV SOLR
40.0000 mg | Freq: Two times a day (BID) | INTRAVENOUS | Status: DC
  Administered 2023-09-05 – 2023-09-09 (×8): 40 mg via INTRAVENOUS
  Filled 2023-09-05 (×8): qty 10

## 2023-09-05 MED ORDER — ONDANSETRON HCL 4 MG/2ML IJ SOLN: 4.0000 mg | Freq: Four times a day (QID) | INTRAMUSCULAR | Status: DC | PRN

## 2023-09-05 MED ORDER — SODIUM CHLORIDE 0.9 % IV BOLUS: 1000.0000 mL | Freq: Once | INTRAVENOUS | Status: DC

## 2023-09-05 MED ORDER — FOLIC ACID 1 MG PO TABS
1.0000 mg | ORAL_TABLET | Freq: Every day | ORAL | Status: DC
Start: 2023-09-05 — End: 2023-09-10
  Administered 2023-09-05 – 2023-09-09 (×4): 1 mg via ORAL
  Filled 2023-09-05 (×5): qty 1

## 2023-09-05 MED ORDER — SODIUM CHLORIDE 0.9 % IV BOLUS
1000.0000 mL | Freq: Once | INTRAVENOUS | Status: AC
  Administered 2023-09-05: 1000 mL via INTRAVENOUS

## 2023-09-05 MED ORDER — ONDANSETRON HCL 4 MG PO TABS: 4.0000 mg | ORAL_TABLET | Freq: Four times a day (QID) | ORAL | Status: DC | PRN

## 2023-09-05 MED ORDER — THIAMINE HCL 100 MG/ML IJ SOLN
100.0000 mg | INTRAMUSCULAR | Status: DC
  Administered 2023-09-05 – 2023-09-07 (×3): 100 mg via INTRAVENOUS
  Filled 2023-09-05 (×3): qty 2

## 2023-09-05 MED ORDER — SODIUM CHLORIDE 0.9 % IV SOLN: INTRAVENOUS | Status: AC

## 2023-09-05 MED ORDER — ACETAMINOPHEN 325 MG PO TABS: 650.0000 mg | ORAL_TABLET | Freq: Four times a day (QID) | ORAL | Status: DC | PRN

## 2023-09-05 MED ORDER — SODIUM CHLORIDE 0.9 % IV SOLN
1.0000 g | Freq: Once | INTRAVENOUS | Status: AC
  Administered 2023-09-05: 1 g via INTRAVENOUS
  Filled 2023-09-05: qty 10

## 2023-09-05 MED ORDER — HYDROCODONE-ACETAMINOPHEN 5-325 MG PO TABS: 1.0000 | ORAL_TABLET | ORAL | Status: DC | PRN

## 2023-09-05 MED ORDER — SODIUM CHLORIDE 0.9 % IV BOLUS
500.0000 mL | Freq: Once | INTRAVENOUS | Status: AC
  Administered 2023-09-05: 500 mL via INTRAVENOUS

## 2023-09-05 MED ORDER — ADULT MULTIVITAMIN W/MINERALS CH
1.0000 | ORAL_TABLET | Freq: Every day | ORAL | Status: DC
  Administered 2023-09-05 – 2023-09-09 (×4): 1 via ORAL
  Filled 2023-09-05 (×5): qty 1

## 2023-09-05 MED ORDER — ACETAMINOPHEN 650 MG RE SUPP: 650.0000 mg | Freq: Four times a day (QID) | RECTAL | Status: DC | PRN

## 2023-09-05 MED ORDER — ONDANSETRON HCL 4 MG/2ML IJ SOLN
4.0000 mg | Freq: Once | INTRAMUSCULAR | Status: AC
  Administered 2023-09-05: 4 mg via INTRAVENOUS
  Filled 2023-09-05: qty 2

## 2023-09-05 MED ORDER — SODIUM ZIRCONIUM CYCLOSILICATE 10 G PO PACK
10.0000 g | PACK | Freq: Once | ORAL | Status: DC
  Filled 2023-09-05: qty 1

## 2023-09-05 MED ORDER — SODIUM BICARBONATE 8.4 % IV SOLN
INTRAVENOUS | Status: DC
  Filled 2023-09-05: qty 150

## 2023-09-05 MED ORDER — SODIUM CHLORIDE 0.9 % IV SOLN
1.0000 g | INTRAVENOUS | Status: DC
  Administered 2023-09-06 – 2023-09-08 (×3): 1 g via INTRAVENOUS
  Filled 2023-09-05 (×3): qty 10

## 2023-09-05 NOTE — ED Notes (Signed)
Pt. States she doesn't have to go to the [facility]. Pt. States,"I am having some kind of reaction to the nausea medication. I just don't even know how to walk. How would we get a urine.?" I explained to pt. We would get a wheelchair and pivot from bed and then again to toilet. Pt. Response,"I just don't know. I don't think I can do that, maybe we could use a bedpan. I don't feel like I have to urinate." Dr. Maple Hudson notified.

## 2023-09-05 NOTE — Assessment & Plan Note (Signed)
Currently hemodynamically stable lactic acid within normal limits continue fluid rehydration continue Rocephin for possible urinary tract infection

## 2023-09-05 NOTE — ED Notes (Signed)
Foley catheter temp. Prob inserted.

## 2023-09-05 NOTE — Assessment & Plan Note (Signed)
Likely with progressive anemia patient is dehydrated suspect with IV fluids hemoglobin will drop transfuse as needed for hemoglobin below 7. Given significant BUN elevation suspect upper GI source. Will let [facility] know pt is here

## 2023-09-05 NOTE — H&P (Signed)
Elaine Campbell KXF:[id number] DOB: 11-19-1931 DOA: 09/05/2023    PCP: Eliezer Lofts, MD   Outpatient Specialists:  NONE   Patient arrived to ER on 09/05/23 at 1327 Referred by Attending Therisa Doyne, MD   Patient coming from:   Independent [facility] [place]    Chief Complaint:   Chief Complaint  Patient presents with   Weakness    HPI: Elaine Campbell is a 89 y.o. female with medical history significant of anemia chronic back pain CKD 3B, HTN     Presented with fatigue  Felt fatigued generalized harder time ambulating in today hard to dress herself, Reports decrease in energy Has been pain medicines for chronic back pain and stopped taking them.  Merlyn Albert comes in to help her eat and drink No fevers or chills no cough no chest pain no abdominal pain no urinary complaints In ER at [facility] was found to be hypothermic down to 93.7 started on Bair hugger    Has been more confused Has had decreased po intake very nauseous Family denies constipation did not report any blood    Denies significant ETOH intake   Does not smoke   No results found for: "SARSCOV2NAA"      Regarding pertinent Chronic problems:      HTN on Cozaar     CKD stage IIIb-   baseline Cr  1.3 CrCl cannot be calculated (Unknown ideal weight.).  Lab Results  Component Value Date   CREATININE 2.22 (H) 09/05/2023   CREATININE 1.27 (H) 04/02/2023   Lab Results  Component Value Date   NA 130 (L) 09/05/2023   CL 106 09/05/2023   K 5.0 09/05/2023   CO2 13 (L) 09/05/2023   BUN 129 (H) 09/05/2023   CREATININE 2.22 (H) 09/05/2023   GFRNONAA 21 (L) 09/05/2023   CALCIUM 9.1 09/05/2023   ALBUMIN 3.0 (L) 09/05/2023   GLUCOSE 109 (H) 09/05/2023   Chronic anemia - baseline hg Hemoglobin & Hematocrit  Recent Labs    04/02/23 1242 09/05/23 1411  HGB 10.1* 9.4*   Iron/TIBC/Ferritin/ %Sat No results found for: "IRON", "TIBC", "FERRITIN", "IRONPCTSAT"    While in ER:  CXR non  acute   Hypothermic started on bare hugger found to have UTI started on rocephin    Lab Orders         Culture, blood (routine x 2)         CBC with Differential         Comprehensive metabolic panel         Lipase, blood         Urinalysis, Routine w reflex microscopic -Urine, Clean Catch         Lactic acid, plasma         Occult blood card to lab, stool Provider will collect         TSH         T4, free         POC occult blood, ED       CXR -  NON acute    Following Medications were ordered in ER: Medications  sodium chloride 0.9 % bolus 1,000 mL (0 mLs Intravenous Stopped 09/05/23 1735)  ondansetron (ZOFRAN) injection 4 mg (4 mg Intravenous Given 09/05/23 1427)  sodium chloride 0.9 % bolus 500 mL (0 mLs Intravenous Stopped 09/05/23 1856)  cefTRIAXone (ROCEPHIN) 1 g in sodium chloride 0.9 % 100 mL IVPB (0 g Intravenous Stopped 09/05/23 1856)  ED Triage Vitals  Encounter Vitals Group     BP 09/05/23 1530 132/67     Systolic BP Percentile --      Diastolic BP Percentile --      Pulse Rate 09/05/23 1530 73     Resp 09/05/23 1530 (!) 38     Temp 09/05/23 1703 (!) 93.7 F (34.3 C)     Temp Source 09/05/23 1703 Rectal     SpO2 09/05/23 1530 98 %     Weight --      Height --      Head Circumference --      Peak Flow --      Pain Score 09/05/23 1354 6     Pain Loc --      Pain Education --      Exclude from Growth Chart --   OVFI(43)@     _________________________________________ Significant initial  Findings: Abnormal Labs Reviewed  CBC WITH DIFFERENTIAL/PLATELET - Abnormal; Notable for the following components:      Result Value   RBC 2.95 (*)    Hemoglobin 9.4 (*)    HCT 27.3 (*)    RDW 17.1 (*)    Neutro Abs 8.0 (*)    Lymphs Abs 0.4 (*)    All other components within normal limits  COMPREHENSIVE METABOLIC PANEL - Abnormal; Notable for the following components:   Sodium 130 (*)    CO2 13 (*)    Glucose, Bld 109 (*)    BUN 129 (*)    Creatinine, Ser  2.22 (*)    Total Protein 5.5 (*)    Albumin 3.0 (*)    Total Bilirubin 0.2 (*)    GFR, Estimated 21 (*)    All other components within normal limits  LIPASE, BLOOD - Abnormal; Notable for the following components:   Lipase 69 (*)    All other components within normal limits  URINALYSIS, ROUTINE W REFLEX MICROSCOPIC - Abnormal; Notable for the following components:   Nitrite POSITIVE (*)    Leukocytes,Ua TRACE (*)    Bacteria, UA MANY (*)    Crystals PRESENT (*)    All other components within normal limits  LACTIC ACID, PLASMA - Abnormal; Notable for the following components:   Lactic Acid, Venous 0.4 (*)    All other components within normal limits  OCCULT BLOOD X 1 CARD TO LAB, STOOL - Abnormal; Notable for the following components:   Fecal Occult Bld POSITIVE (*)    All other components within normal limits    _________________________ Troponin   Cardiac Panel (last 3 results) Recent Labs    09/05/23 1411  TROPONINIHS 9     ECG: Ordered Personally reviewed and interpreted by me showing: HR : 61 Rhythm: Sinus rhythm Atrial premature complexes QTC 392  BNP (last 3 results) Recent Labs    04/02/23 1242  BNP 122.2*     The recent clinical data is shown below. Vitals:   09/05/23 1854 09/05/23 1855 09/05/23 1945 09/06/23 0040  BP:      Pulse: 73 73    Resp: 11 19    Temp: (!) 95.4 F (35.2 C) (!) 95.5 F (35.3 C) 97.8 F (36.6 C) (!) 96.6 F (35.9 C)  TempSrc:   Oral Axillary  SpO2: 97% 98%      WBC     Component Value Date/Time   WBC 9.4 09/05/2023 1411   LYMPHSABS 0.4 (L) 09/05/2023 1411   MONOABS 1.0 09/05/2023 1411   EOSABS 0.0  09/05/2023 1411   BASOSABS 0.0 09/05/2023 1411     Lactic Acid, Venous    Component Value Date/Time   LATICACIDVEN 0.4 (L) 09/05/2023 1800      UA   evidence of UTI     Urine analysis:    Component Value Date/Time   COLORURINE YELLOW 09/05/2023 1734   APPEARANCEUR CLEAR 09/05/2023 1734   LABSPEC 1.010 09/05/2023  1734   PHURINE 5.5 09/05/2023 1734   GLUCOSEU NEGATIVE 09/05/2023 1734   HGBUR NEGATIVE 09/05/2023 1734   BILIRUBINUR NEGATIVE 09/05/2023 1734   KETONESUR NEGATIVE 09/05/2023 1734   PROTEINUR NEGATIVE 09/05/2023 1734   NITRITE POSITIVE (A) 09/05/2023 1734   LEUKOCYTESUR TRACE (A) 09/05/2023 1734    No results found for this or any previous visit.  ABX started Antibiotics Given (last 72 hours)     Date/Time Action Medication Dose Rate   09/05/23 1837 New Bag/Given   cefTRIAXone (ROCEPHIN) 1 g in sodium chloride 0.9 % 100 mL IVPB 1 g 200 mL/hr         Venous  Blood Gas result:  pH  7.17 Low Panic   Acid-base deficit 16.4 High  mmol/L   pCO2, Ven 30 Low  mmHg O2 Saturation 98.1 %  pO2, Ven 148 High  mmHg       __________________________________________________________ Recent Labs  Lab 09/05/23 1411 09/05/23 2047  NA 130* 132*  K 5.0 5.3*  CO2 13* 11*  GLUCOSE 109* 86  BUN 129* 110*  CREATININE 2.22* 2.02*  CALCIUM 9.1 8.1*  MG  --  2.1  PHOS  --  4.5    Cr  Up from baseline see below Lab Results  Component Value Date   CREATININE 2.02 (H) 09/05/2023   CREATININE 2.22 (H) 09/05/2023   CREATININE 1.27 (H) 04/02/2023    Recent Labs  Lab 09/05/23 1411  AST 23  ALT 27  ALKPHOS 52  BILITOT 0.2*  PROT 5.5*  ALBUMIN 3.0*   Lab Results  Component Value Date   CALCIUM 9.1 09/05/2023    Plt: Lab Results  Component Value Date   PLT 162 09/05/2023       Recent Labs  Lab 09/05/23 1411 09/05/23 2047  WBC 9.4 8.1  NEUTROABS 8.0*  --   HGB 9.4* 8.8*  HCT 27.3* 27.4*  MCV 92.5 100.7*  PLT 162 156    HG/HCT   stable,       Component Value Date/Time   HGB 9.4 (L) 09/05/2023 1411   HCT 27.3 (L) 09/05/2023 1411   MCV 92.5 09/05/2023 1411    Recent Labs  Lab 09/05/23 1411  LIPASE 69*   Recent Labs  Lab 09/05/23 2047  AMMONIA 20      _______________________________________________ Hospitalist was called for admission for    AKI  anemia     The following Work up has been ordered so far:  Orders Placed This Encounter  Procedures   Culture, blood (routine x 2)   DG Chest Port 1 View   CBC with Differential   Comprehensive metabolic panel   Lipase, blood   Urinalysis, Routine w reflex microscopic -Urine, Clean Catch   Lactic acid, plasma   Occult blood card to lab, stool Provider will collect   TSH   T4, free   Insert temp foley   Cardiac Monitoring - Continuous Indefinite   Consult to hospitalist   POC occult blood, ED   EKG 12-Lead   EKG 12-Lead   EKG   EKG  Admit to Inpatient (patient's expected length of stay will be greater than 2 midnights or inpatient only procedure)   Admit to Inpatient (patient's expected length of stay will be greater than 2 midnights or inpatient only procedure)     OTHER Significant initial  Findings:  labs showing:         Cultures: No results found for: "SDES", "SPECREQUEST", "CULT", "REPTSTATUS"   Radiological Exams on Admission: DG Chest Port 1 View  Result Date: 09/05/2023 CLINICAL DATA:  Shortness of breath and weakness EXAM: PORTABLE CHEST 1 VIEW COMPARISON:  Chest CT 08/30/2029 FINDINGS: The heart size and mediastinal contours are within normal limits. Both lungs are clear. The visualized skeletal structures are unremarkable. IMPRESSION: No active disease. Electronically Signed   By: Gaylyn Rong M.D.   On: 09/05/2023 14:36   _______________________________________________________________________________________________________ Latest  Blood pressure 110/60, pulse 73, temperature (!) 95.5 F (35.3 C), resp. rate 19, SpO2 98%.   Vitals  labs and radiology finding personally reviewed  Review of Systems:    Pertinent positives include:  , fatigue,   Constitutional:  No weight loss, night sweats, Fevers, chillsweight loss  HEENT:  No headaches, Difficulty swallowing,Tooth/dental problems,Sore throat,  No sneezing, itching, ear ache, nasal congestion, post nasal  drip,  Cardio-vascular:  No chest pain, Orthopnea, PND, anasarca, dizziness, palpitations.no Bilateral lower extremity swelling  GI:  No heartburn, indigestion, abdominal pain, nausea, vomiting, diarrhea, change in bowel habits, loss of appetite, melena, blood in stool, hematemesis Resp:  no shortness of breath at rest. No dyspnea on exertion, No excess mucus, no productive cough, No non-productive cough, No coughing up of blood.No change in color of mucus.No wheezing. Skin:  no rash or lesions. No jaundice GU:  no dysuria, change in color of urine, no urgency or frequency. No straining to urinate.  No flank pain.  Musculoskeletal:  No joint pain or no joint swelling. No decreased range of motion. No back pain.  Psych:  No change in mood or affect. No depression or anxiety. No memory loss.  Neuro: no localizing neurological complaints, no tingling, no weakness, no double vision, no gait abnormality, no slurred speech, no confusion  All systems reviewed and apart from HOPI all are negative _______________________________________________________________________________________________ Past Medical History:  History reviewed. No pertinent past medical history.    History reviewed. No pertinent surgical history.  Social History:  Ambulatory   walker    reports that she has never smoked. She has never used smokeless tobacco. She reports that she does not currently use alcohol. She reports that she does not use drugs.    Family History:    History reviewed. No pertinent family history. ______________________________________________________________________________________________ Allergies: Allergies  Allergen Reactions   Amoxicillin Nausea And Vomiting     Prior to Admission medications   Medication Sig Start Date End Date Taking? Authorizing Provider  losartan (COZAAR) 50 MG tablet Take 1 tablet (50 mg total) by mouth daily. 04/20/23   Mast, Man X, NP     ___________________________________________________________________________________________________ Physical Exam:    09/05/2023    6:55 PM 09/05/2023    6:54 PM 09/05/2023    6:53 PM  Vitals with BMI  Pulse 73 73 74    1. General:  in No  Acute distress   Chronically ill -appearing 2. Psychological: Alert and  Oriented 3. Head/ENT:    Dry Mucous Membranes  Head Non traumatic, neck supple                          Poor Dentition 4. SKIN:  decreased Skin turgor,  Skin clean Dry and intact no rash 5. Heart: Regular rate and rhythm cardiac Murmur, no Rub or gallop 6. Lungs: no wheezes or crackles   7. Abdomen: Soft,  non-tender, Non distended  bowel sounds present 8. Lower extremities: no clubbing, cyanosis, 1+ edema 9. Neurologically Grossly intact, moving all 4 extremities equally  10. MSK: Normal range of motion    Chart has been reviewed  ______________________________________________________________________________________________  Assessment/Plan 89 y.o. female with medical history significant of anemia chronic back pain CKD 3B, HTN    Admitted for   Fatigue , UTI hypothermia AKI    Present on Admission:  AKI (acute kidney injury) (HCC)  Sepsis (HCC)  CKD (chronic kidney disease) stage 3, GFR 30-59 ml/min (HCC)  HTN (hypertension)  Anemia  Occult blood in stools  Hypothermia  UTI (urinary tract infection)  Hyponatremia  Hyperkalemia  Metabolic acidosis  AKI (acute kidney injury) (HCC) Appears to be secondary to severe dehydration With significantly elevated BUN Obtain urine electrolytes rehydrate and follow  CKD (chronic kidney disease) stage 3, GFR 30-59 ml/min (HCC)  -chronic avoid nephrotoxic medications such as NSAIDs, Vanco Zosyn combo,  avoid hypotension, continue to follow renal function   Sepsis (HCC) Currently hemodynamically stable lactic acid within normal limits continue fluid rehydration continue Rocephin for possible  urinary tract infection  HTN (hypertension) Allow permissive hypertension  Anemia Obtain anemia panel  Transfuse for Hg <7 , rapidly dropping or  if symptomatic Obtain Hemoccult stool   Occult blood in stools Likely with progressive anemia patient is dehydrated suspect with IV fluids hemoglobin will drop transfuse as needed for hemoglobin below 7. Given significant BUN elevation suspect upper GI source. Will let [facility] know pt is here   Hypothermia In the setting of UTI Likely worsening anemia Generalized fatigue Blood cultures pending Check TSH cortisol to level  UTI (urinary tract infection)  - treat with Rocephin        await results of urine culture and adjust antibiotic coverage as needed   Hyponatremia In the setting of dehydration and decreased po intake Obtain Urine elctrolytes   Hyperkalemia Treated with Lokelma and monitor on tele In the setting of worsening acidosis   Metabolic acidosis Non gap metabolic acidosis Possible RTA Start on bicarb drip discussed with nephrology  Serial BMET and VBG If no significant improvement will need nephrology consult  Addison's disease on dif, check cortisol level  Other plan as per orders.  DVT prophylaxis:  SCD     Code Status:  DNR/DNI  as per patient   I had personally discussed CODE STATUS with patient   ACP   none    Family Communication:   Family not at  Bedside  Diet clears and NPO post  midnight in case needs a GI study Disposition Plan:     likely will need placement for rehabilitation                             Following barriers for discharge:                            Hypothermia is worked up  Electrolytes corrected                               Anemia corrected h/H stable                                                       Will need consultants to evaluate patient prior to discharge       Consult Orders  (From admission, onward)           Start      Ordered   09/05/23 1621  Consult to hospitalist  Paged Hospitalist via [facility] 4:26p  Once       Provider:  (Not yet assigned)  Question Answer Comment  Place call to: [facility]   Reason for Consult Admit      09/05/23 1620                             Would benefit from PT/OT eval prior to DC  Ordered                    Consults called: Enid Baas Dr. Levora Angel     Admission status:  ED Disposition     ED Disposition  Admit   Condition  --   Comment  Hospital [facility] [[id number]]  Level of Care: Stepdown [14]  Admit to SDU based on following criteria: Severe physiological/psychological symptoms:  Any diagnosis requiring assessment & intervention at least every 4 hours on an ongoing basis to obtain desired patient outcomes including stability and rehabilitation  May admit patient to [facility] or [facility] if equivalent level of care is available:: No  Interfacility transfer: Yes  Covid Evaluation: Asymptomatic - no recent exposure (last 10 days) testing not required  Diagnosis: Sepsis [facility]) [[id number]]  Admitting Physician: Lewie Chamber [phone]  Attending Physician: Lewie Chamber [phone]  Certification:: I certify this patient will need inpatient services for at least 2 midnights           inpatient     I Expect 2 midnight stay secondary to severity of patient's current illness need for inpatient interventions justified by the following:  hemodynamic instability despite optimal treatment (hypothermia)  Severe lab/radiological/exam abnormalities including:    Anemia, uti    That are currently affecting medical management.   I expect  patient to be hospitalized for 2 midnights requiring inpatient medical care.  Patient is at high risk for adverse outcome (such as loss of life or disability) if not treated.  Indication for inpatient stay as follows:    Need for operative/procedural  intervention New hypohtermia   Need for IV  antibiotics, IV fluids,   Level of care  stepdown   tele indefinitely please discontinue once patient no longer qualifies COVID-19 Labs    No results found for: "SARSCOV2NAA"   Generoso Cropper 09/06/2023, 1:22 AM   [facility]     after 2 AM please page floor coverage PA If 7AM-7PM, please contact the day team taking care of the patient using [email or web address]

## 2023-09-05 NOTE — Assessment & Plan Note (Signed)
Allow permissive hypertension 

## 2023-09-05 NOTE — ED Provider Notes (Signed)
[place] EMERGENCY DEPARTMENT AT [facility] Provider Note   CSN: [id number] Arrival date & time: 09/05/23  1327     History  Chief Complaint  Patient presents with   Weakness    Elaine Campbell is a 89 y.o. female.  89 yo F with a chief complaints of generalized fatigue.  She said over the past week or perhaps longer she has lost the desire and energy to get up and do things that she needs to do.  She had been having significant back pain that has been an ongoing problem for her that she stopped taking her medications for.  A friend came over and has been helping her at home helping her eat and drink.  She had a couple virtual visits with her family doctor who is moved to [place].  They have been communicating with her for over this time.  She is actually gotten a little bit better after starting some NSAIDs and having some increased effort with feeding.  She denies any obvious infectious symptoms denies rash denies cough congestion or fever denies abdominal pain or chest pain denies urinary symptoms.   Weakness      Home Medications Prior to Admission medications   Medication Sig Start Date End Date Taking? Authorizing Provider  losartan (COZAAR) 50 MG tablet Take 1 tablet (50 mg total) by mouth daily. 04/20/23   Mast, Man X, NP      Allergies    Amoxicillin    Review of Systems   Review of Systems  Neurological:  Positive for weakness.    Physical Exam Updated Vital Signs There were no vitals taken for this visit. Physical Exam Vitals and nursing note reviewed.  Constitutional:      General: She is not in acute distress.    Appearance: She is well-developed. She is not diaphoretic.  HENT:     Head: Normocephalic and atraumatic.  Eyes:     Pupils: Pupils are equal, round, and reactive to light.  Cardiovascular:     Rate and Rhythm: Normal rate and regular rhythm.     Heart sounds: No murmur heard.    No friction rub. No gallop.  Pulmonary:     Effort:  Pulmonary effort is normal.     Breath sounds: No wheezing or rales.  Abdominal:     General: There is no distension.     Palpations: Abdomen is soft.     Tenderness: There is no abdominal tenderness.  Musculoskeletal:        General: No tenderness.     Cervical back: Normal range of motion and neck supple.  Skin:    General: Skin is warm and dry.  Neurological:     Mental Status: She is alert and oriented to person, place, and time.  Psychiatric:        Behavior: Behavior normal.     ED Results / Procedures / Treatments   Labs (all labs ordered are listed, but only abnormal results are displayed) Labs Reviewed  CBC WITH DIFFERENTIAL/PLATELET - Abnormal; Notable for the following components:      Result Value   RBC 2.95 (*)    Hemoglobin 9.4 (*)    HCT 27.3 (*)    RDW 17.1 (*)    Neutro Abs 8.0 (*)    Lymphs Abs 0.4 (*)    All other components within normal limits  COMPREHENSIVE METABOLIC PANEL  LIPASE, BLOOD  URINALYSIS, ROUTINE W REFLEX MICROSCOPIC  TROPONIN I (HIGH SENSITIVITY)  EKG EKG Interpretation Date/Time:  Sunday September 05 2023 14:09:06 EDT Ventricular Rate:  61 PR Interval:  190 QRS Duration:  99 QT Interval:  389 QTC Calculation: 392 R Axis:   14  Text Interpretation: Sinus rhythm Atrial premature complexes No significant change since last tracing Confirmed by Melene Plan [phone]) on 09/05/2023 2:27:38 PM  Radiology DG Chest Port 1 View  Result Date: 09/05/2023 CLINICAL DATA:  Shortness of breath and weakness EXAM: PORTABLE CHEST 1 VIEW COMPARISON:  Chest CT 08/30/2029 FINDINGS: The heart size and mediastinal contours are within normal limits. Both lungs are clear. The visualized skeletal structures are unremarkable. IMPRESSION: No active disease. Electronically Signed   By: Gaylyn Rong M.D.   On: 09/05/2023 14:36    Procedures Procedures    Medications Ordered in ED Medications  sodium chloride 0.9 % bolus 1,000 mL (1,000 mLs  Intravenous New Bag/Given 09/05/23 1426)  ondansetron (ZOFRAN) injection 4 mg (4 mg Intravenous Given 09/05/23 1427)    ED Course/ Medical Decision Making/ A&P                                 Medical Decision Making Amount and/or Complexity of Data Reviewed Labs: ordered. Radiology: ordered. ECG/medicine tests: ordered.  Risk Prescription drug management.   89 yo F with a chief complaints of generalized fatigue.  This has been ongoing for at least a week.  It sounds like she got to the point where she felt weak she did not want to eat or drink and thought this was going to be the end of her life.  A friend has been spending a lot of time with her and has been getting her to eat and drink and is started her back on some of her medications.  She has been communicating with her family doctor via Zoom.  After a long discussion with the patient and her friend will obtain a laboratory evaluation and screen for infection.  Bolus of IV fluids.  Reassess.  Troponin negative, hemoglobin is 9.4.  Not significantly changed from last check.  Awaiting metabolic panel UA.  Chest x-ray independently interpreted by me without focal infiltrate or pneumothorax.  Signed out to Dr. Maple Hudson, please see their note for further details of care in the ED.  The patients results and plan were reviewed and discussed.   Any x-rays performed were independently reviewed by myself.   Differential diagnosis were considered with the presenting HPI.  Medications  sodium chloride 0.9 % bolus 1,000 mL (1,000 mLs Intravenous New Bag/Given 09/05/23 1426)  ondansetron (ZOFRAN) injection 4 mg (4 mg Intravenous Given 09/05/23 1427)    There were no vitals filed for this visit.  Final diagnoses:  Fatigue, unspecified type    Admission/ observation were discussed with the admitting physician, patient and/or family and they are comfortable with the plan.          Final Clinical Impression(s) / ED Diagnoses Final  diagnoses:  Fatigue, unspecified type    Rx / DC Orders ED Discharge Orders     None         Melene Plan, DO 09/05/23 1511

## 2023-09-05 NOTE — Subjective & Objective (Signed)
Felt fatigued generalized harder time ambulating in today hard to dress herself, Reports decrease in energy Has been pain medicines for chronic back pain and stopped taking them.  Elaine Campbell comes in to help her eat and drink No fevers or chills no cough no chest pain no abdominal pain no urinary complaints In ER at [facility] was found to be hypothermic down to 93.7 started on [employer]

## 2023-09-05 NOTE — ED Triage Notes (Signed)
She reports no fever, nor any other sign of current illness. She states that she has felt "weak" recently, and today she felt "It was even hard for me to dress myself". She mentions recent issues with constipation. She is alert and oriented x 4 with clear speech.

## 2023-09-05 NOTE — Discharge Instructions (Addendum)
Please follow-up with your family doctor.  He did not drink as well as you can for the next couple days.

## 2023-09-05 NOTE — Assessment & Plan Note (Signed)
-   treat with Rocephin         await results of urine culture and adjust antibiotic coverage as needed  

## 2023-09-05 NOTE — Plan of Care (Signed)
Plan of Care Note for accepted transfer   Patient: Elaine Campbell MRN: [id number]   DOA: 09/05/2023  Facility requesting transfer: [facility] Requesting Provider: Dr. Maple Hudson Reason for transfer: AKI Facility course:  [name] is an 89 yo female with no known PMH (none in epic) but possible CKD3b who presented to [facility] with ongoing weakness/lethargy/fatigue.  On workup, she was found to have BUN 129, creatinine 2.22.  This may explain her presenting symptoms.  Largely increased BUN does raise some concern for possible GIB. Recommended FOBT check during triage call.  Few labs for comparison in epic but last creatinine was 1.27 in May 2024. On workup, hemoglobin noted to be 9.4 g/dL and baseline possibly around 10 g/dL however given her significant presumed dehydration, real hemoglobin may be much lower after volume resuscitation.  Follow-up FOBT.  Patient transferring for further fluid resuscitation and workup of above; and probable evaluation with PT/OT.   Plan of care: The patient is accepted for admission to Med-surg  unit, at [facility].  Author: Lewie Chamber, MD 09/05/2023  Check [email or web address] for on-call coverage.  Nursing staff, Please call [facility] Admits & Consults System-Wide number on Amion as soon as patient's arrival, so appropriate admitting provider can evaluate the pt.

## 2023-09-05 NOTE — Assessment & Plan Note (Addendum)
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

## 2023-09-05 NOTE — ED Notes (Signed)
Pt. With rectal temp. 93.7. Dr. Maple Hudson notified. Bair hugger applied to patient.

## 2023-09-05 NOTE — Assessment & Plan Note (Signed)
In the setting of UTI Likely worsening anemia Generalized fatigue Blood cultures pending Check TSH cortisol to level

## 2023-09-05 NOTE — Assessment & Plan Note (Signed)
-  chronic avoid nephrotoxic medications such as NSAIDs, Vanco Zosyn combo,  avoid hypotension, continue to follow renal function  

## 2023-09-05 NOTE — Assessment & Plan Note (Signed)
Obtain anemia panel  Transfuse for Hg <7 , rapidly dropping or  if symptomatic Obtain Hemoccult stool

## 2023-09-05 NOTE — Assessment & Plan Note (Signed)
Appears to be secondary to severe dehydration With significantly elevated BUN Obtain urine electrolytes rehydrate and follow

## 2023-09-05 NOTE — Assessment & Plan Note (Signed)
In the setting of dehydration and decreased po intake Obtain Urine elctrolytes

## 2023-09-05 NOTE — ED Provider Notes (Signed)
Received signout; 89 year old with generalized malaise and weakness for the past week and has decreased p.o. intake.  Pending basic labs.  Received IV fluids and Zofran.  Reevaluation, patient states she does not feel improved  Physical Exam  BP 99/73   Pulse 65   Temp (!) 93.7 F (34.3 C) (Rectal)   Resp 14   SpO2 99%   Physical Exam Vitals reviewed.  HENT:     Mouth/Throat:     Mouth: Mucous membranes are dry.  Eyes:     Conjunctiva/sclera: Conjunctivae normal.  Cardiovascular:     Rate and Rhythm: Normal rate.  Neurological:     Mental Status: She is alert.     Procedures  Procedures  ED Course / MDM    Medical Decision Making 89 year old with generalized malaise and weakness.  Hypothermic, vital signs otherwise reassuring.  Appears to have AKI with significant uremia.  This may explain her symptoms.  She has received IV fluids.  Admitting to the hospital for further IV fluids and management.  Spoke with hospitalist who agrees to admit patient.  Amount and/or Complexity of Data Reviewed Labs: ordered. Radiology: ordered. ECG/medicine tests: ordered.  Risk Prescription drug management. Decision regarding hospitalization.         Coral Spikes, DO 09/05/23 1714

## 2023-09-05 NOTE — Assessment & Plan Note (Signed)
Treated with Lokelma and monitor on tele In the setting of worsening acidosis

## 2023-09-06 ENCOUNTER — Inpatient Hospital Stay (HOSPITAL_COMMUNITY): Payer: Medicare Other

## 2023-09-06 DIAGNOSIS — Z66 Do not resuscitate: Secondary | ICD-10-CM | POA: Diagnosis not present

## 2023-09-06 DIAGNOSIS — Z515 Encounter for palliative care: Secondary | ICD-10-CM | POA: Diagnosis not present

## 2023-09-06 DIAGNOSIS — R011 Cardiac murmur, unspecified: Secondary | ICD-10-CM | POA: Diagnosis not present

## 2023-09-06 DIAGNOSIS — A4151 Sepsis due to Escherichia coli [E. coli]: Secondary | ICD-10-CM | POA: Diagnosis not present

## 2023-09-06 DIAGNOSIS — N179 Acute kidney failure, unspecified: Secondary | ICD-10-CM | POA: Diagnosis not present

## 2023-09-06 DIAGNOSIS — N170 Acute kidney failure with tubular necrosis: Secondary | ICD-10-CM | POA: Diagnosis not present

## 2023-09-06 LAB — GLUCOSE, CAPILLARY
Glucose-Capillary: 118 mg/dL — ABNORMAL HIGH (ref 70–99)
Glucose-Capillary: 144 mg/dL — ABNORMAL HIGH (ref 70–99)
Glucose-Capillary: 178 mg/dL — ABNORMAL HIGH (ref 70–99)
Glucose-Capillary: 193 mg/dL — ABNORMAL HIGH (ref 70–99)
Glucose-Capillary: 211 mg/dL — ABNORMAL HIGH (ref 70–99)
Glucose-Capillary: 86 mg/dL (ref 70–99)
Glucose-Capillary: 97 mg/dL (ref 70–99)

## 2023-09-06 LAB — RESPIRATORY PANEL BY PCR

## 2023-09-06 LAB — BLOOD GAS, VENOUS
Acid-base deficit: 13.3 mmol/L — ABNORMAL HIGH (ref 0.0–2.0)
Bicarbonate: 11.9 mmol/L — ABNORMAL LOW (ref 20.0–28.0)
Drawn by: 8212
O2 Saturation: 96.8 %
Patient temperature: 37.2
pCO2, Ven: 26 mm[Hg] — ABNORMAL LOW (ref 44–60)
pH, Ven: 7.27 (ref 7.25–7.43)
pO2, Ven: 110 mm[Hg] — ABNORMAL HIGH (ref 32–45)

## 2023-09-06 LAB — BASIC METABOLIC PANEL
Anion gap: 6 (ref 5–15)
BUN: 97 mg/dL — ABNORMAL HIGH (ref 8–23)
CO2: 15 mmol/L — ABNORMAL LOW (ref 22–32)
Calcium: 7.7 mg/dL — ABNORMAL LOW (ref 8.9–10.3)
Chloride: 113 mmol/L — ABNORMAL HIGH (ref 98–111)
Creatinine, Ser: 2.15 mg/dL — ABNORMAL HIGH (ref 0.44–1.00)
GFR, Estimated: 21 mL/min — ABNORMAL LOW (ref >60)
Glucose, Bld: 117 mg/dL — ABNORMAL HIGH (ref 70–99)
Potassium: 5.1 mmol/L (ref 3.5–5.1)
Sodium: 134 mmol/L — ABNORMAL LOW (ref 135–145)

## 2023-09-06 LAB — COMPREHENSIVE METABOLIC PANEL
ALT: 24 U/L (ref 0–44)
AST: 20 U/L (ref 15–41)
Albumin: 2 g/dL — ABNORMAL LOW (ref 3.5–5.0)
Alkaline Phosphatase: 49 U/L (ref 38–126)
Anion gap: 5 (ref 5–15)
BUN: 85 mg/dL — ABNORMAL HIGH (ref 8–23)
CO2: 14 mmol/L — ABNORMAL LOW (ref 22–32)
Calcium: 7.8 mg/dL — ABNORMAL LOW (ref 8.9–10.3)
Chloride: 115 mmol/L — ABNORMAL HIGH (ref 98–111)
Creatinine, Ser: 2.06 mg/dL — ABNORMAL HIGH (ref 0.44–1.00)
GFR, Estimated: 23 mL/min — ABNORMAL LOW (ref >60)
Glucose, Bld: 102 mg/dL — ABNORMAL HIGH (ref 70–99)
Potassium: 5.4 mmol/L — ABNORMAL HIGH (ref 3.5–5.1)
Sodium: 134 mmol/L — ABNORMAL LOW (ref 135–145)
Total Bilirubin: 0.9 mg/dL (ref 0.3–1.2)
Total Protein: 4.2 g/dL — ABNORMAL LOW (ref 6.5–8.1)

## 2023-09-06 LAB — ECHOCARDIOGRAM COMPLETE
AR max vel: 1.83 cm2
AV Area VTI: 2.09 cm2
AV Area mean vel: 1.97 cm2
AV Mean grad: 10.5 mm[Hg]
AV Peak grad: 18.9 mm[Hg]
Ao pk vel: 2.18 m/s
Area-P 1/2: 2.49 cm2
Height: 62.992 in
S' Lateral: 2.7 cm
Weight: 1982.38 [oz_av]

## 2023-09-06 LAB — CBC
HCT: 23.6 % — ABNORMAL LOW (ref 36.0–46.0)
Hemoglobin: 8 g/dL — ABNORMAL LOW (ref 12.0–15.0)
MCH: 33.2 pg (ref 26.0–34.0)
MCHC: 33.9 g/dL (ref 30.0–36.0)
MCV: 97.9 fL (ref 80.0–100.0)
Platelets: 148 10*3/uL — ABNORMAL LOW (ref 150–400)
RBC: 2.41 MIL/uL — ABNORMAL LOW (ref 3.87–5.11)
RDW: 17.1 % — ABNORMAL HIGH (ref 11.5–15.5)
WBC: 7.2 10*3/uL (ref 4.0–10.5)
nRBC: 0.3 % — ABNORMAL HIGH (ref 0.0–0.2)

## 2023-09-06 LAB — CORTISOL-AM, BLOOD: Cortisol - AM: 16 ug/dL (ref 6.7–22.6)

## 2023-09-06 LAB — T4, FREE
Free T4: 0.93 ng/dL (ref 0.61–1.12)
Free T4: 0.91 ng/dL (ref 0.61–1.12)

## 2023-09-06 LAB — PHOSPHORUS: Phosphorus: 4.4 mg/dL (ref 2.5–4.6)

## 2023-09-06 LAB — APTT: aPTT: 38 s — ABNORMAL HIGH (ref 24–36)

## 2023-09-06 LAB — MRSA NEXT GEN BY PCR, NASAL: MRSA by PCR Next Gen: NOT DETECTED

## 2023-09-06 LAB — PROTIME-INR
INR: 1.2 (ref 0.8–1.2)
Prothrombin Time: 15.4 s — ABNORMAL HIGH (ref 11.4–15.2)

## 2023-09-06 LAB — OSMOLALITY, URINE: Osmolality, Ur: 359 mosm/kg (ref 300–900)

## 2023-09-06 LAB — MAGNESIUM: Magnesium: 2.2 mg/dL (ref 1.7–2.4)

## 2023-09-06 LAB — TSH: TSH: 3.856 u[IU]/mL (ref 0.350–4.500)

## 2023-09-06 LAB — CORTISOL: Cortisol, Plasma: 16.4 ug/dL

## 2023-09-06 LAB — SARS CORONAVIRUS 2 BY RT PCR: SARS Coronavirus 2 by RT PCR: NEGATIVE

## 2023-09-06 MED ORDER — CHLORHEXIDINE GLUCONATE CLOTH 2 % EX PADS
6.0000 | MEDICATED_PAD | Freq: Every day | CUTANEOUS | Status: DC
Start: 2023-09-06 — End: 2023-09-10
  Administered 2023-09-06 – 2023-09-09 (×4): 6 via TOPICAL

## 2023-09-06 MED ORDER — SODIUM CHLORIDE 0.9 % IV BOLUS
500.0000 mL | Freq: Once | INTRAVENOUS | Status: AC
  Administered 2023-09-06: 500 mL via INTRAVENOUS

## 2023-09-06 MED ORDER — BARIUM SULFATE 2 % PO SUSP: 450.0000 mL | ORAL | Status: AC

## 2023-09-06 MED ORDER — KATE FARMS STANDARD 1.4 PO LIQD
325.0000 mL | Freq: Every day | ORAL | Status: DC
  Administered 2023-09-06 – 2023-09-09 (×3): 325 mL via ORAL
  Filled 2023-09-06 (×4): qty 325

## 2023-09-06 MED ORDER — HYDROCORTISONE SOD SUC (PF) 100 MG IJ SOLR
100.0000 mg | Freq: Three times a day (TID) | INTRAMUSCULAR | Status: DC
  Administered 2023-09-06 (×2): 100 mg via INTRAVENOUS
  Filled 2023-09-06 (×2): qty 2

## 2023-09-06 MED ORDER — ORAL CARE MOUTH RINSE: 15.0000 mL | OROMUCOSAL | Status: DC | PRN

## 2023-09-06 MED ORDER — DICLOFENAC SODIUM 1 % EX GEL
4.0000 g | Freq: Four times a day (QID) | CUTANEOUS | Status: DC
  Administered 2023-09-06 – 2023-09-09 (×9): 4 g via TOPICAL
  Filled 2023-09-06: qty 100

## 2023-09-06 MED ORDER — SODIUM BICARBONATE 8.4 % IV SOLN
INTRAVENOUS | Status: DC
  Filled 2023-09-06 (×3): qty 150

## 2023-09-06 MED ORDER — SODIUM CHLORIDE 0.9 % IV BOLUS: 500.0000 mL | Freq: Once | INTRAVENOUS | Status: DC

## 2023-09-06 NOTE — Progress Notes (Signed)
Initial Nutrition Assessment  DOCUMENTATION CODES:   Not applicable  INTERVENTION:  - Regular diet.  [employer] 1.4 PO once daily, provides 455 kcal and 20 grams protein. - Encourage intake as tolerated.  - Multivitamin with minerals daily. - Monitor weight trends.   NUTRITION DIAGNOSIS:   Increased nutrient needs related to acute illness as evidenced by estimated needs.  GOAL:   Patient will meet greater than or equal to 90% of their needs  MONITOR:   PO intake, Supplement acceptance, Weight trends  REASON FOR ASSESSMENT:   Consult Assessment of nutrition requirement/status  ASSESSMENT:   89 y.o. female from independent living with PMH of chronic anemia, CKD stage IIIb, HTN who presented with complaints of not feeling well, fatigue and tired for at least 3 days. Admitted for sepsis due to UTI.   Patient reports a UBW of 123# and denies any recent changes in weight. Per EMR, weight stable since weight history began in May.   Patient endorses eating usually 2 meals a day, breakfast and dinner. Occasionally eats a small lunch. Drinks organic whey protein powder shakes at home, once a day. Appetite has been a little decreased recently but patient unsure for how long.   Current appetite remains decreased and patient had just a small amount of oatmeal and OJ for breakfast today.  Discussed importance of trying to eat something at all 3 meals. She is agreeable to try Molli Posey during admission as she doesn't like the additives in tradition ONS.   Medications reviewed and include: 1mg  folic acid, MVI, Thiamine  Labs reviewed:  Na 134 Creatinine 2.15   NUTRITION - FOCUSED PHYSICAL EXAM:  Flowsheet Row Most Recent Value  Orbital Region Mild depletion  Upper Arm Region No depletion  Thoracic and Lumbar Region No depletion  Buccal Region No depletion  Temple Region Moderate depletion  Clavicle Bone Region Mild depletion  Clavicle and Acromion Bone Region Mild  depletion  Scapular Bone Region Unable to assess  Dorsal Hand Mild depletion  Patellar Region No depletion  Anterior Thigh Region No depletion  Posterior Calf Region No depletion  Edema (RD Assessment) Moderate  Hair Reviewed  Eyes Reviewed  Mouth Reviewed  Skin Reviewed  Nails Reviewed       Diet Order:   Diet Order             Diet regular Fluid consistency: Thin  Diet effective now                   EDUCATION NEEDS:  Education needs have been addressed  Skin:  Skin Assessment: Reviewed RN Assessment  Last BM:  PTA  Height:  Ht Readings from Last 1 Encounters:  09/05/23 5' 2.99" (1.6 m)   Weight:  Wt Readings from Last 1 Encounters:  09/05/23 56.2 kg    BMI:  Body mass index is 21.95 kg/m.  Estimated Nutritional Needs:  Kcal:  1400-1600 kcals Protein:  65-80 grams Fluid:  >/= 1.4L    Shelle Iron RD, LDN For contact information, refer to [facility].

## 2023-09-06 NOTE — Evaluation (Signed)
Physical Therapy Evaluation Patient Details Name: Elaine Campbell MRN: [id number] DOB: June 07, 1932 Today's Date: 09/06/2023  History of Present Illness  89 yo female admitted with AKI, UTI, weakness, fatigue. Hx of chronic pain-multi joint, CKD, anemia  Clinical Impression  On eval, pt required Max A for bed mobility and Min-Mod A for transfers,ambulation. Pt presents with general weakness, decreased activity tolerance, and impaired gait balance. Mobility is limited by chronic pain.She denied dizziness during session. Pt is weak and fatigues fairly easily with activity. At this time, feel pt could benefit from continued inpatient follow up therapy, <3 hours/day. Will plan to follow and progress activity as tolerated.       If plan is discharge home, recommend the following: A lot of help with walking and/or transfers;A lot of help with bathing/dressing/bathroom;Assistance with cooking/housework;Assist for transportation;Help with stairs or ramp for entrance   Can travel by private vehicle        Equipment Recommendations None recommended by PT  Recommendations for Other Services  OT consult    Functional Status Assessment Patient has had a recent decline in their functional status and demonstrates the ability to make significant improvements in function in a reasonable and predictable amount of time.     Precautions / Restrictions Precautions Precautions: Fall Restrictions Weight Bearing Restrictions: No      Mobility  Bed Mobility Overal bed mobility: Needs Assistance Bed Mobility: Supine to Sit     Supine to sit: HOB elevated, Max assist     General bed mobility comments: Assist for trunk and bil LEs. Increased time. Utilized bepad for positioning at EOB.    Transfers Overall transfer level: Needs assistance Equipment used: Rolling walker (2 wheels) Transfers: Sit to/from Stand, Bed to chair/wheelchair/BSC Sit to Stand: Mod assist, From elevated surface   Step pivot  transfers: Min assist       General transfer comment: Assist to rise, steady, control descent. Cues for safety, technique, hand placement. Increased time.    Ambulation/Gait Ambulation/Gait assistance: Min assist Gait Distance (Feet): 3 Feet Assistive device: Rolling walker (2 wheels) Gait Pattern/deviations: Step-to pattern       General Gait Details: Cues for safety, technique. Slow, effortful steps. Assist to stabilize pt and manage RW. Fatigues quickly/easily.  Stairs            Wheelchair Mobility     Tilt Bed    Modified Rankin (Stroke Patients Only)       Balance Overall balance assessment: Needs assistance         Standing balance support: Reliant on assistive device for balance, Bilateral upper extremity supported, During functional activity Standing balance-Leahy Scale: Poor                               Pertinent Vitals/Pain Pain Assessment Pain Assessment: Faces Faces Pain Scale: Hurts even more Pain Location: multi joint pain Pain Descriptors / Indicators: Discomfort Pain Intervention(s): Limited activity within patient's tolerance, Monitored during session    Home Living Family/patient expects to be discharged to:: Unsure Living Arrangements: Alone Available Help at Discharge: Personal care attendant (3 hours/day most days of week (most recently)) Type of Home: Independent living facility Home Access: Level entry       Home Layout: One level Home Equipment: Agricultural consultant (2 wheels);Wheelchair - power;Grab bars - tub/shower;Grab bars - toilet      Prior Function Prior Level of Function : Needs assist  Mobility Comments: used wallker for short distances but also used power chair inside apartment ADLs Comments: had some assistance     Extremity/Trunk Assessment   Upper Extremity Assessment Upper Extremity Assessment: Defer to OT evaluation    Lower Extremity Assessment Lower Extremity Assessment:  Generalized weakness    Cervical / Trunk Assessment Cervical / Trunk Assessment: Kyphotic  Communication   Communication Communication: No apparent difficulties  Cognition Arousal: Alert Behavior During Therapy: WFL for tasks assessed/performed Overall Cognitive Status: Within Functional Limits for tasks assessed Area of Impairment: Memory                     Memory: Decreased short-term memory                  General Comments      Exercises     Assessment/Plan    PT Assessment Patient needs continued PT services  PT Problem List Decreased strength;Decreased range of motion;Decreased activity tolerance;Decreased balance;Decreased mobility;Decreased knowledge of use of DME;Decreased cognition;Pain       PT Treatment Interventions DME instruction;Gait training;Functional mobility training;Therapeutic activities;Therapeutic exercise;Patient/family education;Balance training    PT Goals (Current goals can be found in the Care Plan section)  Acute Rehab PT Goals Patient Stated Goal: to get better PT Goal Formulation: With patient Time For Goal Achievement: 09/20/23 Potential to Achieve Goals: Good    Frequency Min 1X/week     Co-evaluation               AM-PAC PT "6 Clicks" Mobility  Outcome Measure Help needed turning from your back to your side while in a flat bed without using bedrails?: A Lot Help needed moving from lying on your back to sitting on the side of a flat bed without using bedrails?: A Lot Help needed moving to and from a bed to a chair (including a wheelchair)?: A Lot Help needed standing up from a chair using your arms (e.g., wheelchair or bedside chair)?: A Lot Help needed to walk in hospital room?: A Lot Help needed climbing 3-5 steps with a railing? : Total 6 Click Score: 11    End of Session Equipment Utilized During Treatment: Gait belt Activity Tolerance: Patient limited by fatigue;Patient limited by pain Patient left:  in chair;with call bell/phone within reach;with nursing/sitter in room;with family/visitor present   PT Visit Diagnosis: Muscle weakness (generalized) (M62.81);Difficulty in walking, not elsewhere classified (R26.2)    Time: 7829-5621 PT Time Calculation (min) (ACUTE ONLY): 20 min   Charges:   PT Evaluation $PT Eval Low Complexity: 1 Low   PT General Charges $$ ACUTE PT VISIT: 1 Visit            Faye Ramsay, PT Acute Rehabilitation  Office: [phone]

## 2023-09-06 NOTE — Progress Notes (Signed)
PROGRESS NOTE    Elaine Campbell  NGE:[id number] DOB: May 27, 1932 DOA: 09/05/2023 PCP: Eliezer Lofts, MD    Brief Narrative:  89 year old from independent living, has history of chronic anemia, CKD stage IIIb, hypertension, lives by herself and uses motorized chair to go around brought to the emergency room with complaints of not feeling well, fatigue and tired for at least 3 days.  In the emergency room she was found to have temperature 93.7, she had abnormal urine.  She had decreased oral intake and was very nauseous.  Admitted with hypothermia and UTI.  Also noted to have AKI.  Subjective: Patient seen and examined.  She tells me that she feels very weak and does not really know what to tell me. On Lawyer.  Temperature has been corrected.  Complains of bilateral knee pain and hip pain and being very weak.  Remains on bicarbonate.  Potassium has improved. With slight drop in hemoglobin, seen by GI and declined to have endoscopy. She declined CT scan oral contrast, will hydrate her more before ordering another scan.  Continue conservative management.  Assessment & Plan:   Sepsis present on admission secondary to UTI.  Hypothermia.  AKI. Patient was resuscitated with isotonic fluid.  Currently remains on bicarbonate infusion.  Blood pressures are adequate.  Temperature has been normalized. Continue Rocephin.  Pending urine cultures and blood cultures. Continue supportive care.  AKI on CKD stage IIIb: Recent baseline creatinine of 1.27.  This is likely prerenal.  She has a Foley catheter in place.  Will renew bicarbonate infusion and continue for the time being.  Monitor closely.  Discontinue Lokelma.  Anemia of chronic disease: Baseline known hemoglobin of 10.  Hemoglobin 8 today.  BUN has responded to IV fluids.  She is positive for FOBT.  There is no clinical evidence of ongoing bleeding.  On Protonix.  Declined endoscopy by patient and her healthcare power of attorney.  Continue  supportive measures.  Will monitor closely.  B12, iron levels are adequate.   DVT prophylaxis: SCDs Start: 09/05/23 2022   Code Status: DNR Family Communication: None at bedside Disposition Plan: Status is: Inpatient Remains inpatient appropriate because: Severe acute illness     Consultants:  Gastroenterology  Procedures:  None  Antimicrobials:  Rocephin 10/27---     Objective: Vitals:   09/06/23 0800 09/06/23 0900 09/06/23 1000 09/06/23 1026  BP: (!) 140/42 (!) 104/36 (!) 106/38 (!) 108/39  Pulse: 83 86 78 78  Resp: 12 14 13 17   Temp: 99.3 F (37.4 C) 99.3 F (37.4 C) 98.8 F (37.1 C) 98.6 F (37 C)  TempSrc: Core     SpO2: 99% 96% 97% 97%  Weight:      Height:        Intake/Output Summary (Last 24 hours) at 09/06/2023 1114 Last data filed at 09/06/2023 1019 Gross per 24 hour  Intake 3979.8 ml  Output 1200 ml  Net 2779.8 ml   Filed Weights   09/05/23 2000  Weight: 56.2 kg    Examination:  General exam: Appears calm but anxious.  Very frail and debilitated. Patient is alert awake and mostly oriented.  No deficits on neurological exam. Respiratory system: Clear to auscultation.  No added sounds. Cardiovascular system: S1 & S2 heard, RRR.  No pedal edema. Gastrointestinal system: Soft.  Nontender. Psychiatry: Judgement and insight appear normal.  Flat affect and anxious.    Data Reviewed: I have personally reviewed following labs and imaging studies  CBC: Recent Labs  Lab 09/05/23 1411 09/05/23 2047 09/06/23 0259  WBC 9.4 8.1 7.2  NEUTROABS 8.0*  --   --   HGB 9.4* 8.8* 8.0*  HCT 27.3* 27.4* 23.6*  MCV 92.5 100.7* 97.9  PLT 162 156 148*   Basic Metabolic Panel: Recent Labs  Lab 09/05/23 1411 09/05/23 2047 09/06/23 0259 09/06/23 0934  NA 130* 132* 134* 134*  K 5.0 5.3* 5.4* 5.1  CL 106 112* 115* 113*  CO2 13* 11* 14* 15*  GLUCOSE 109* 86 102* 117*  BUN 129* 110* 85* 97*  CREATININE 2.22* 2.02* 2.06* 2.15*  CALCIUM 9.1 8.1*  7.8* 7.7*  MG  --  2.1 2.2  --   PHOS  --  4.5 4.4  --    GFR: Estimated Creatinine Clearance: 14.7 mL/min (A) (by C-G formula based on SCr of 2.15 mg/dL (H)). Liver Function Tests: Recent Labs  Lab 09/05/23 1411 09/06/23 0259  AST 23 20  ALT 27 24  ALKPHOS 52 49  BILITOT 0.2* 0.9  PROT 5.5* 4.2*  ALBUMIN 3.0* 2.0*   Recent Labs  Lab 09/05/23 1411  LIPASE 69*   Recent Labs  Lab 09/05/23 2047  AMMONIA 20   Coagulation Profile: Recent Labs  Lab 09/06/23 0259  INR 1.2   Cardiac Enzymes: Recent Labs  Lab 09/05/23 2047  CKTOTAL 102   BNP (last 3 results) No results for input(s): "PROBNP" in the last 8760 hours. HbA1C: No results for input(s): "HGBA1C" in the last 72 hours. CBG: Recent Labs  Lab 09/05/23 2037 09/06/23 0115 09/06/23 0336 09/06/23 0808  GLUCAP 78 86 97 118*   Lipid Profile: No results for input(s): "CHOL", "HDL", "LDLCALC", "TRIG", "CHOLHDL", "LDLDIRECT" in the last 72 hours. Thyroid Function Tests: Recent Labs    09/05/23 2047  TSH 3.388  FREET4 0.91   Anemia Panel: Recent Labs    09/05/23 2047  VITAMINB12 1,073*  FOLATE 15.8  FERRITIN 237  TIBC 259  IRON 55  RETICCTPCT 1.6   Sepsis Labs: Recent Labs  Lab 09/05/23 1800  LATICACIDVEN 0.4*    Recent Results (from the past 240 hour(s))  Culture, blood (routine x 2)     Status: None (Preliminary result)   Collection Time: 09/05/23  8:37 PM   Specimen: BLOOD  Result Value Ref Range Status   Specimen Description   Final    BLOOD BLOOD LEFT ARM AEROBIC BOTTLE ONLY Performed at [facility], [address], [place], [place] [address]    Special Requests   Final    Blood Culture adequate volume BOTTLES DRAWN AEROBIC ONLY Performed at [facility], [address], [place], [place] [address]    Culture   Final    NO GROWTH < 12 HOURS Performed at [facility], [address] [place], [place] [address]    Report Status PENDING   Incomplete  Culture, blood (routine x 2)     Status: None (Preliminary result)   Collection Time: 09/05/23  8:47 PM   Specimen: BLOOD  Result Value Ref Range Status   Specimen Description   Final    BLOOD BLOOD LEFT ARM AEROBIC BOTTLE ONLY Performed at [facility], [address], [place], [place] [address]    Special Requests   Final    Blood Culture adequate volume BOTTLES DRAWN AEROBIC ONLY Performed at [facility], [address], [place], [place] [address]    Culture   Final    NO GROWTH <  12 HOURS Performed at [facility], [address] [place], [place] [address]    Report Status PENDING  Incomplete  SARS Coronavirus 2 by RT PCR (hospital order, performed in [facility] hospital lab) *cepheid single result test* Anterior Nasal Swab     Status: None   Collection Time: 09/05/23 10:13 PM   Specimen: Anterior Nasal Swab  Result Value Ref Range Status   SARS Coronavirus 2 by RT PCR NEGATIVE NEGATIVE Final    Comment: Performed at [facility], [address] [place], [place] [address]  Respiratory (~20 pathogens) panel by PCR     Status: None   Collection Time: 09/05/23 10:13 PM   Specimen: Nasopharyngeal Swab; Respiratory  Result Value Ref Range Status   Adenovirus NOT DETECTED NOT DETECTED Final   Coronavirus 229E NOT DETECTED NOT DETECTED Final    Comment: (NOTE) The Coronavirus on the Respiratory Panel, DOES NOT test for the novel  Coronavirus (2019 nCoV)    Coronavirus HKU1 NOT DETECTED NOT DETECTED Final   Coronavirus NL63 NOT DETECTED NOT DETECTED Final   Coronavirus OC43 NOT DETECTED NOT DETECTED Final   Metapneumovirus NOT DETECTED NOT DETECTED Final   Rhinovirus / Enterovirus NOT DETECTED NOT DETECTED Final   Influenza A NOT DETECTED NOT DETECTED Final   Influenza B NOT DETECTED NOT DETECTED Final   Parainfluenza Virus 1 NOT DETECTED NOT DETECTED Final   Parainfluenza Virus 2 NOT DETECTED NOT DETECTED Final    Parainfluenza Virus 3 NOT DETECTED NOT DETECTED Final   Parainfluenza Virus 4 NOT DETECTED NOT DETECTED Final   Respiratory Syncytial Virus NOT DETECTED NOT DETECTED Final   Bordetella pertussis NOT DETECTED NOT DETECTED Final   Bordetella Parapertussis NOT DETECTED NOT DETECTED Final   Chlamydophila pneumoniae NOT DETECTED NOT DETECTED Final   Mycoplasma pneumoniae NOT DETECTED NOT DETECTED Final    Comment: Performed at [facility], [address] [place], [place] [address]         Radiology Studies: DG Chest Port 1 View  Result Date: 09/05/2023 CLINICAL DATA:  Shortness of breath and weakness EXAM: PORTABLE CHEST 1 VIEW COMPARISON:  Chest CT 08/30/2029 FINDINGS: The heart size and mediastinal contours are within normal limits. Both lungs are clear. The visualized skeletal structures are unremarkable. IMPRESSION: No active disease. Electronically Signed   By: Gaylyn Rong M.D.   On: 09/05/2023 14:36        Scheduled Meds:  Chlorhexidine Gluconate Cloth  6 each Topical Daily   feeding supplement (KATE FARMS STANDARD 1.4)  325 mL Oral Daily   folic acid  1 mg Oral Daily   multivitamin with minerals  1 tablet Oral Daily   pantoprazole (PROTONIX) IV  40 mg Intravenous Q12H   thiamine (VITAMIN B1) injection  100 mg Intravenous Q24H   Continuous Infusions:  cefTRIAXone (ROCEPHIN)  IV     sodium bicarbonate 150 mEq in dextrose 5 % 1,150 mL infusion       LOS: 1 day    Time spent: 50 minutes    Dorcas Carrow, MD [facility]

## 2023-09-06 NOTE — Plan of Care (Signed)
  Problem: Education: Goal: Knowledge of General Education information will improve Description: Including pain rating scale, medication(s)/side effects and non-pharmacologic comfort measures Outcome: Progressing   Problem: Health Behavior/Discharge Planning: Goal: Ability to manage health-related needs will improve Outcome: Progressing   Problem: Clinical Measurements: Goal: Ability to maintain clinical measurements within normal limits will improve Outcome: Progressing Goal: Diagnostic test results will improve Outcome: Progressing Goal: Cardiovascular complication will be avoided Outcome: Progressing   Problem: Activity: Goal: Risk for activity intolerance will decrease Outcome: Progressing   Problem: Nutrition: Goal: Adequate nutrition will be maintained Outcome: Progressing

## 2023-09-06 NOTE — TOC CM/SW Note (Signed)
Patient from [facility] per chart review.  Formal assessment to be completed at later time.

## 2023-09-06 NOTE — Consult Note (Signed)
[facility] Consult  Referring Provider: [facility] hospitalist Primary Care Physician:  Eliezer Lofts, MD Primary Gastroenterologist: Gentry Fitz  Reason for Consultation: Anemia, FOBT positive stool  HPI: Elaine Campbell is a 89 y.o. female was brought from independent living at friend's home due to tiredness and weakness. Patient is oriented x 3 and states she has been feeling very lethargic and tired. She complains of having a rectal prolapse, occasionally needing to strain to have a bowel movement, usually has a bowel movement once every 3 days, occasionally has fecal leakage, however, denies noticing blood in stool or black stools. She keeps repeating that her physician of 20 years moved to [place] and that has really upset her. I spoke with her health power of attorney [name] over the phone, and was told that her physician who moved recommended holistic measures and is seeing this patient has not had a prior endoscopy or colonoscopy. She denies acid reflux, heartburn, difficulty swallowing, pain on swallowing, unintentional weight loss or loss of appetite. She states she usually uses a motorized chair, however has been bedbound for the last 3 days and feels extremely weak. She takes Naprosyn at home as needed, otherwise is not on any antiplatelets, aspirin or Goody powders.   History reviewed. No pertinent past medical history.  History reviewed. No pertinent surgical history.  Prior to Admission medications   Medication Sig Start Date End Date Taking? Authorizing Provider  losartan (COZAAR) 25 MG tablet Take 25 mg by mouth daily.   Yes [provider]  naproxen sodium (ALEVE) 220 MG tablet Take 220 mg by mouth daily as needed.   Yes [provider]  losartan (COZAAR) 50 MG tablet Take 1 tablet (50 mg total) by mouth daily. Patient not taking: Reported on 09/05/2023 04/20/23   Mast, Man X, NP    Current Facility-Administered Medications  Medication Dose  Route Frequency Provider Last Rate Last Admin   acetaminophen (TYLENOL) tablet 650 mg  650 mg Oral Q6H PRN Therisa Doyne, MD       Or   acetaminophen (TYLENOL) suppository 650 mg  650 mg Rectal Q6H PRN Doutova, Anastassia, MD       barium (READI-CAT 2) 2 % suspension 450 mL  450 mL Oral Q1 Hr x 2 Doutova, Anastassia, MD       cefTRIAXone (ROCEPHIN) 1 g in sodium chloride 0.9 % 100 mL IVPB  1 g Intravenous Q24H Doutova, Anastassia, MD       Chlorhexidine Gluconate Cloth 2 % PADS 6 each  6 each Topical Daily Doutova, Anastassia, MD       folic acid (FOLVITE) tablet 1 mg  1 mg Oral Daily Doutova, Anastassia, MD   1 mg at 09/05/23 2200   HYDROcodone-acetaminophen (NORCO/VICODIN) 5-325 MG per tablet 1-2 tablet  1-2 tablet Oral Q4H PRN Doutova, Anastassia, MD       hydrocortisone sodium succinate (SOLU-CORTEF) 100 MG injection 100 mg  100 mg Intravenous Q8H Doutova, Anastassia, MD   100 mg at 09/06/23 0133   multivitamin with minerals tablet 1 tablet  1 tablet Oral Daily Doutova, Anastassia, MD   1 tablet at 09/05/23 2200   ondansetron (ZOFRAN) tablet 4 mg  4 mg Oral Q6H PRN Doutova, Anastassia, MD       Or   ondansetron (ZOFRAN) injection 4 mg  4 mg Intravenous Q6H PRN Doutova, Anastassia, MD       Oral care mouth rinse  15 mL Mouth Rinse PRN Therisa Doyne, MD  pantoprazole (PROTONIX) injection 40 mg  40 mg Intravenous Q12H Doutova, Anastassia, MD   40 mg at 09/05/23 2229   sodium bicarbonate 150 mEq in dextrose 5 % 1,150 mL infusion   Intravenous Continuous Therisa Doyne, MD 75 mL/hr at 09/06/23 0648 Infusion Verify at 09/06/23 0648   sodium zirconium cyclosilicate (LOKELMA) packet 10 g  10 g Oral Once Therisa Doyne, MD       thiamine (VITAMIN B1) injection 100 mg  100 mg Intravenous Q24H Doutova, Anastassia, MD   100 mg at 09/05/23 2159    Allergies as of 09/05/2023 - Review Complete 09/05/2023  Allergen Reaction Noted   Amoxicillin Nausea And Vomiting 04/02/2023     History reviewed. No pertinent family history.  Social History   Socioeconomic History   Marital status: Widowed    Spouse name: Not on file   Number of children: Not on file   Years of education: Not on file   Highest education level: Not on file  Occupational History   Not on file  Tobacco Use   Smoking status: Never   Smokeless tobacco: Never  Substance and Sexual Activity   Alcohol use: Not Currently   Drug use: Never   Sexual activity: Not Currently  Other Topics Concern   Not on file  Social History Narrative   Not on file   Social Determinants of Health   Financial Resource Strain: Not on file  Food Insecurity: No Food Insecurity (09/05/2023)   Hunger Vital Sign    Worried About Running Out of Food in the Last Year: Never true    Ran Out of Food in the Last Year: Never true  Transportation Needs: No Transportation Needs (09/05/2023)   PRAPARE - Administrator, Civil Service (Medical): No    Lack of Transportation (Non-Medical): No  Physical Activity: Not on file  Stress: Not on file  Social Connections: Not on file  Intimate Partner Violence: Not At Risk (09/06/2023)   Humiliation, Afraid, Rape, and Kick questionnaire    Fear of Current or Ex-Partner: No    Emotionally Abused: No    Physically Abused: No    Sexually Abused: No    Review of Systems: As per HPI  Physical Exam: Vital signs in last 24 hours: Temp:  [93 F (33.9 C)-98.2 F (36.8 C)] 98.2 F (36.8 C) (10/28 0700) Pulse Rate:  [61-83] 83 (10/28 0700) Resp:  [7-38] 12 (10/28 0700) BP: (83-133)/(28-73) 133/44 (10/28 0700) SpO2:  [97 %-100 %] 97 % (10/28 0700) Weight:  [56.2 kg] 56.2 kg (10/27 2000) Last BM Date :  (PTA)  General:   Elderly, depressed mood Head:  Normocephalic and atraumatic. Eyes:  Sclera clear, no icterus.   Prominent pallor. Ears:  Normal auditory acuity. Nose:  No deformity, discharge,  or lesions. Mouth:  No deformity or lesions.  Oropharynx pink &  moist. Neck:  Supple; no masses or thyromegaly. Lungs:  Clear throughout to auscultation.   No wheezes, crackles, or rhonchi. No acute distress. Heart:  Regular rate and rhythm; no murmurs, clicks, rubs,  or gallops. Extremities:  Without clubbing or edema. Neurologic:  Alert and  oriented x4;  grossly normal neurologically. Skin:  Intact without significant lesions or rashes. Psych:  Alert and cooperative. Normal mood and affect. Abdomen:  Soft, nontender and nondistended. No masses, hepatosplenomegaly or hernias noted. Normal bowel sounds, without guarding, and without rebound.         Lab Results: Recent Labs    09/05/23  1411 09/05/23 2047 09/06/23 0259  WBC 9.4 8.1 7.2  HGB 9.4* 8.8* 8.0*  HCT 27.3* 27.4* 23.6*  PLT 162 156 148*   BMET Recent Labs    09/05/23 1411 09/05/23 2047 09/06/23 0259  NA 130* 132* 134*  K 5.0 5.3* 5.4*  CL 106 112* 115*  CO2 13* 11* 14*  GLUCOSE 109* 86 102*  BUN 129* 110* 85*  CREATININE 2.22* 2.02* 2.06*  CALCIUM 9.1 8.1* 7.8*   LFT Recent Labs    09/06/23 0259  PROT 4.2*  ALBUMIN 2.0*  AST 20  ALT 24  ALKPHOS 49  BILITOT 0.9   PT/INR Recent Labs    09/06/23 0259  LABPROT 15.4*  INR 1.2    Studies/Results: DG Chest Port 1 View  Result Date: 09/05/2023 CLINICAL DATA:  Shortness of breath and weakness EXAM: PORTABLE CHEST 1 VIEW COMPARISON:  Chest CT 08/30/2029 FINDINGS: The heart size and mediastinal contours are within normal limits. Both lungs are clear. The visualized skeletal structures are unremarkable. IMPRESSION: No active disease. Electronically Signed   By: Gaylyn Rong M.D.   On: 09/05/2023 14:36    Impression: Anemia, elevated BUN/creatinine ratio of 85/2.06 with a low GFR of 23, FOBT positive, normal iron panel, vitamin B12 not low, hemoglobin 9.4/8.8/8, PT/INR normal 15.4/1.2  Denies melena or hematochezia Impaired renal function Hyperkalemia Acidosis Malnutrition, albumin 2, total protein  4.2 Lipase minimally elevated at 69  Plan: Patient is a DNR, DNI, states there is no point on doing an endoscopy. Spoke with the health power of attorney, [name], who reiterates that patient does not want endoscopic procedures. FOBT and stool could be related to history of rectal prolapse. A CT abdomen and pelvis without contrast has been ordered. Patient is on pantoprazole 40 mg every 12 hours. At this point given patient's wishes to not have any endoscopic procedures, GI will sign off, recommend conservative management, blood transfusion if needed, continuation of PPI and H&H monitoring.   LOS: 1 day   Kerin Salen, MD  09/06/2023, 9:18 AM

## 2023-09-06 NOTE — Evaluation (Signed)
Occupational Therapy Evaluation Patient Details Name: Elaine Campbell MRN: [id number] DOB: 09-Mar-1932 Today's Date: 09/06/2023   History of Present Illness patient is a 88 year old female admitted with AKI, UTI, weakness, fatigue. Hx of chronic pain-multi joint, CKD, anemia   Clinical Impression   Patient is a 89 year old female who was admitted for above. Patient was living at home with independence in ADLs and caregiver support for IADLs at rollator level. Currently, patient is max A for sit to stands from recliner with ability to progress balance to CGA with increased time on feet and education. Patient's friend was present in room during session. Patient was noted to have decreased functional activity tolerance, decreased endurance, decreased standing balance, decreased safety awareness, and decreased knowledge of AD/AE impacting participation in ADLs. Patient will benefit from continued inpatient follow up therapy, <3 hours/day        If plan is discharge home, recommend the following: Assist for transportation;Direct supervision/assist for medications management;Direct supervision/assist for financial management;Help with stairs or ramp for entrance;Two people to help with walking and/or transfers;A lot of help with bathing/dressing/bathroom;Assistance with cooking/housework    Functional Status Assessment  Patient has had a recent decline in their functional status and demonstrates the ability to make significant improvements in function in a reasonable and predictable amount of time.  Equipment Recommendations  None recommended by OT       Precautions / Restrictions Precautions Precautions: Fall Restrictions Weight Bearing Restrictions: No      Mobility Bed Mobility               General bed mobility comments: patient was up in recliner at start of session and returned to the same at end of session.            ADL either performed or assessed with clinical judgement    ADL Overall ADL's : Needs assistance/impaired Eating/Feeding: Sitting;Supervision/ safety Eating/Feeding Details (indicate cue type and reason): taking sips of warm water. patient prefers for it to be warm   Grooming Details (indicate cue type and reason): patient declined reporting she has special toothpaste that she uses at home. her friend says he will bring her these things when he comes the next time. Upper Body Bathing: Minimal assistance;Sitting   Lower Body Bathing: Sitting/lateral leans;Maximal assistance   Upper Body Dressing : Minimal assistance;Sitting   Lower Body Dressing: Maximal assistance;Sitting/lateral leans Lower Body Dressing Details (indicate cue type and reason): simulated with lateral leaning sitting in recliner for educaiton on pressure relief. patient unable to get hand under bottom and maintain offloading positioning in recliner on either side. noted to have pain in R hip with leaning on it for L side offload pressure. Toilet Transfer: Maximal assistance;Rolling walker (2 wheels) Toilet Transfer Details (indicate cue type and reason): patietn was max A for sit to stand from recliner with increased time to progress standing balance to CGA. patient was educated on proepr positioning in standing. and positioning for hands to increase independence with standign from reclienr and sitting down. Toileting- Clothing Manipulation and Hygiene: Total assistance;Sit to/from stand Toileting - Clothing Manipulation Details (indicate cue type and reason): unable to reach bottom with lateral leaning sitting in recliner either.       General ADL Comments: educated on strategies to avoid UTIs in future. friend in room reported her UTIs are only from her kidney failure.     Vision Baseline Vision/History: 1 Wears glasses  Pertinent Vitals/Pain Pain Assessment Pain Assessment: Faces Faces Pain Scale: Hurts little more Pain Location: multi joint pain Pain  Descriptors / Indicators: Discomfort Pain Intervention(s): Limited activity within patient's tolerance, Monitored during session     Extremity/Trunk Assessment Upper Extremity Assessment Upper Extremity Assessment: Generalized weakness;RUE deficits/detail;LUE deficits/detail;Right hand dominant RUE Deficits / Details: able to reach up to about 90 degrees bilaterally noted to have signs of arthritis in digit joints. grip strength WFL LUE Deficits / Details: able to reach up to about 90 degrees bilaterally noted to have signs of arthritis in digit joints. grip strength WFL   Lower Extremity Assessment Lower Extremity Assessment: Defer to PT evaluation   Cervical / Trunk Assessment Cervical / Trunk Assessment: Kyphotic   Communication Communication Communication: No apparent difficulties   Cognition Arousal: Alert Behavior During Therapy: WFL for tasks assessed/performed Overall Cognitive Status: Within Functional Limits for tasks assessed           Memory: Decreased short-term memory         General Comments: had friend present in room during session as well.                Home Living Family/patient expects to be discharged to:: Unsure Living Arrangements: Alone Available Help at Discharge: Personal care attendant (3 hours a day) Type of Home: Independent living facility Home Access: Level entry     Home Layout: One level         Bathroom Toilet: Handicapped height Bathroom Accessibility: Yes   Home Equipment: Agricultural consultant (2 wheels);Wheelchair - power;Grab bars - tub/shower;Grab bars - toilet          Prior Functioning/Environment Prior Level of Function : Needs assist             Mobility Comments: used wallker for short distances but also used power chair inside apartment ADLs Comments: had some assistance from caregiver but prior to this was independent        OT Problem List: Decreased activity tolerance;Impaired balance (sitting and/or  standing);Decreased coordination;Decreased safety awareness;Decreased knowledge of precautions;Impaired UE functional use      OT Treatment/Interventions: Self-care/ADL training;Therapeutic exercise;DME and/or AE instruction;Therapeutic activities;Patient/family education;Balance training    OT Goals(Current goals can be found in the care plan section) Acute Rehab OT Goals Patient Stated Goal: to get better OT Goal Formulation: With patient Time For Goal Achievement: 09/20/23 Potential to Achieve Goals: Fair  OT Frequency: Min 1X/week       AM-PAC OT "6 Clicks" Daily Activity     Outcome Measure Help from another person eating meals?: A Little Help from another person taking care of personal grooming?: A Little Help from another person toileting, which includes using toliet, bedpan, or urinal?: A Lot Help from another person bathing (including washing, rinsing, drying)?: A Lot Help from another person to put on and taking off regular upper body clothing?: A Little Help from another person to put on and taking off regular lower body clothing?: A Lot 6 Click Score: 15   End of Session Equipment Utilized During Treatment: Gait belt;Rolling walker (2 wheels) Nurse Communication: Mobility status  Activity Tolerance: Patient tolerated treatment well Patient left: in chair;with call bell/phone within reach;with family/visitor present  OT Visit Diagnosis: Unsteadiness on feet (R26.81);Other abnormalities of gait and mobility (R26.89);Muscle weakness (generalized) (M62.81);Pain                Time: 1610-9604 OT Time Calculation (min): 33 min Charges:  OT General Charges $OT Visit: 1 Visit  OT Evaluation $OT Eval Moderate Complexity: 1 Mod OT Treatments $Self Care/Home Management : 8-22 mins  Rosalio Loud, MS Acute Rehabilitation Department Office# [phone]   Selinda Flavin 09/06/2023, 3:17 PM

## 2023-09-06 NOTE — Plan of Care (Signed)
Admission completed, plan of care and goal discussed with patient and family member, time given for questions and answers, bare hugger applies due to temperature being 35.5, MRSA,SARS and RSV panel collected along with UA and culture, 2nd IV placed for extra access. Patient handbook/guide at bedside.  Problem: Education: Goal: Knowledge of General Education information will improve Description: Including pain rating scale, medication(s)/side effects and non-pharmacologic comfort measures Outcome: Progressing   Problem: Health Behavior/Discharge Planning: Goal: Ability to manage health-related needs will improve Outcome: Progressing   Problem: Clinical Measurements: Goal: Ability to maintain clinical measurements within normal limits will improve Outcome: Progressing Goal: Will remain free from infection Outcome: Progressing Goal: Diagnostic test results will improve Outcome: Progressing Goal: Respiratory complications will improve Outcome: Progressing Goal: Cardiovascular complication will be avoided Outcome: Progressing   Problem: Activity: Goal: Risk for activity intolerance will decrease Outcome: Progressing   Problem: Nutrition: Goal: Adequate nutrition will be maintained Outcome: Progressing   Problem: Coping: Goal: Level of anxiety will decrease Outcome: Progressing   Problem: Elimination: Goal: Will not experience complications related to bowel motility Outcome: Progressing Goal: Will not experience complications related to urinary retention Outcome: Progressing   Problem: Pain Management: Goal: General experience of comfort will improve Outcome: Progressing   Problem: Safety: Goal: Ability to remain free from injury will improve Outcome: Progressing   Problem: Skin Integrity: Goal: Risk for impaired skin integrity will decrease Outcome: Progressing   Problem: Fluid Volume: Goal: Hemodynamic stability will improve Outcome: Progressing   Problem:  Clinical Measurements: Goal: Signs and symptoms of infection will decrease Outcome: Progressing   Problem: Respiratory: Goal: Ability to maintain adequate ventilation will improve Outcome: Progressing

## 2023-09-07 DIAGNOSIS — N179 Acute kidney failure, unspecified: Secondary | ICD-10-CM | POA: Diagnosis not present

## 2023-09-07 LAB — GLUCOSE, CAPILLARY
Glucose-Capillary: 146 mg/dL — ABNORMAL HIGH (ref 70–99)
Glucose-Capillary: 128 mg/dL — ABNORMAL HIGH (ref 70–99)
Glucose-Capillary: 132 mg/dL — ABNORMAL HIGH (ref 70–99)
Glucose-Capillary: 115 mg/dL — ABNORMAL HIGH (ref 70–99)

## 2023-09-07 LAB — CBC WITH DIFFERENTIAL/PLATELET
Abs Immature Granulocytes: 0.03 K/uL (ref 0.00–0.07)
Basophils Absolute: 0 K/uL (ref 0.0–0.1)
Basophils Relative: 0 %
Eosinophils Absolute: 0 K/uL (ref 0.0–0.5)
Eosinophils Relative: 0 %
HCT: 20.6 % — ABNORMAL LOW (ref 36.0–46.0)
Hemoglobin: 7.1 g/dL — ABNORMAL LOW (ref 12.0–15.0)
Immature Granulocytes: 0 %
Lymphocytes Relative: 4 %
Lymphs Abs: 0.3 K/uL — ABNORMAL LOW (ref 0.7–4.0)
MCH: 32.3 pg (ref 26.0–34.0)
MCHC: 34.5 g/dL (ref 30.0–36.0)
MCV: 93.6 fL (ref 80.0–100.0)
Monocytes Absolute: 0.5 K/uL (ref 0.1–1.0)
Monocytes Relative: 8 %
Neutro Abs: 5.9 K/uL (ref 1.7–7.7)
Neutrophils Relative %: 88 %
Platelets: 177 K/uL (ref 150–400)
RBC: 2.2 MIL/uL — ABNORMAL LOW (ref 3.87–5.11)
RDW: 16.8 % — ABNORMAL HIGH (ref 11.5–15.5)
WBC: 6.7 K/uL (ref 4.0–10.5)
nRBC: 0.3 % — ABNORMAL HIGH (ref 0.0–0.2)

## 2023-09-07 LAB — COMPREHENSIVE METABOLIC PANEL
ALT: 25 U/L (ref 0–44)
AST: 22 U/L (ref 15–41)
Albumin: 1.8 g/dL — ABNORMAL LOW (ref 3.5–5.0)
Alkaline Phosphatase: 44 U/L (ref 38–126)
Anion gap: 8 (ref 5–15)
BUN: 96 mg/dL — ABNORMAL HIGH (ref 8–23)
CO2: 17 mmol/L — ABNORMAL LOW (ref 22–32)
Calcium: 7.6 mg/dL — ABNORMAL LOW (ref 8.9–10.3)
Chloride: 112 mmol/L — ABNORMAL HIGH (ref 98–111)
Creatinine, Ser: 2.09 mg/dL — ABNORMAL HIGH (ref 0.44–1.00)
GFR, Estimated: 22 mL/min — ABNORMAL LOW (ref >60)
Glucose, Bld: 144 mg/dL — ABNORMAL HIGH (ref 70–99)
Potassium: 4.6 mmol/L (ref 3.5–5.1)
Sodium: 137 mmol/L (ref 135–145)
Total Bilirubin: 0.7 mg/dL (ref 0.3–1.2)
Total Protein: 4.1 g/dL — ABNORMAL LOW (ref 6.5–8.1)

## 2023-09-07 LAB — PREPARE RBC (CROSSMATCH)

## 2023-09-07 LAB — PHOSPHORUS: Phosphorus: 3.1 mg/dL (ref 2.5–4.6)

## 2023-09-07 LAB — T3: T3, Total: 58 ng/dL — ABNORMAL LOW (ref 71–180)

## 2023-09-07 LAB — MAGNESIUM: Magnesium: 2.1 mg/dL (ref 1.7–2.4)

## 2023-09-07 MED ORDER — SODIUM CHLORIDE 0.9% IV SOLUTION: Freq: Once | INTRAVENOUS | Status: AC

## 2023-09-07 MED ORDER — ENSURE ENLIVE PO LIQD: 237.0000 mL | Freq: Two times a day (BID) | ORAL | Status: DC

## 2023-09-07 MED ORDER — DEXTROSE-SODIUM CHLORIDE 5-0.45 % IV SOLN: INTRAVENOUS | Status: DC

## 2023-09-07 NOTE — Plan of Care (Signed)
  Problem: Education: Goal: Knowledge of General Education information will improve Description: Including pain rating scale, medication(s)/side effects and non-pharmacologic comfort measures Outcome: Progressing   Problem: Health Behavior/Discharge Planning: Goal: Ability to manage health-related needs will improve Outcome: Progressing   Problem: Clinical Measurements: Goal: Ability to maintain clinical measurements within normal limits will improve Outcome: Progressing Goal: Will remain free from infection Outcome: Progressing Goal: Diagnostic test results will improve Outcome: Progressing Goal: Respiratory complications will improve Outcome: Progressing Goal: Cardiovascular complication will be avoided Outcome: Progressing   Problem: Activity: Goal: Risk for activity intolerance will decrease Outcome: Progressing   Problem: Coping: Goal: Level of anxiety will decrease Outcome: Progressing   Problem: Pain Management: Goal: General experience of comfort will improve Outcome: Progressing   Problem: Safety: Goal: Ability to remain free from injury will improve Outcome: Progressing   Problem: Skin Integrity: Goal: Risk for impaired skin integrity will decrease Outcome: Progressing   Problem: Fluid Volume: Goal: Hemodynamic stability will improve Outcome: Progressing   Problem: Clinical Measurements: Goal: Signs and symptoms of infection will decrease Outcome: Progressing   Problem: Respiratory: Goal: Ability to maintain adequate ventilation will improve Outcome: Progressing

## 2023-09-07 NOTE — Plan of Care (Signed)
  Problem: Education: Goal: Knowledge of General Education information will improve Description: Including pain rating scale, medication(s)/side effects and non-pharmacologic comfort measures Outcome: Progressing   Problem: Pain Management: Goal: General experience of comfort will improve Outcome: Progressing

## 2023-09-07 NOTE — Progress Notes (Signed)
Final   Bordetella Parapertussis NOT DETECTED NOT DETECTED Final   Chlamydophila pneumoniae NOT DETECTED NOT DETECTED Final   Mycoplasma pneumoniae NOT DETECTED NOT DETECTED Final    Comment: Performed at [facility], [address], [place], [place] [address]  MRSA Next Gen by PCR, Nasal     Status: None   Collection Time: 09/06/23 10:39 AM   Specimen: Nasal Mucosa; Nasal Swab  Result Value Ref Range Status   MRSA by PCR Next Gen NOT DETECTED NOT DETECTED Final    Comment: (NOTE) The GeneXpert MRSA Assay (FDA approved for NASAL specimens only), is one component of a comprehensive MRSA colonization surveillance program. It is not intended to diagnose MRSA infection nor to guide or monitor treatment for MRSA infections. Test performance is not FDA approved in patients less than 48 years old. Performed at [facility], [address], [place], [place] [address]          Radiology Studies: ECHOCARDIOGRAM COMPLETE  Result Date: 09/06/2023    ECHOCARDIOGRAM REPORT   Patient Name:   Elaine Campbell Date of Exam: 09/06/2023 Medical Rec #:  [id number]    Height:       63.0 in Accession #:    [id number]   Weight:       123.9 lb Date of Birth:  Mar 11, 1932     BSA:          1.578 m Patient Age:    89 years     BP:           140/42 mmHg Patient Gender: F            HR:           88 bpm. Exam Location:  Inpatient Procedure: 2D Echo, Color Doppler and Cardiac Doppler Indications:    Murmur  History:        Patient has no prior history of Echocardiogram examinations.                 Signs/Symptoms:Murmur.  Sonographer:    Darlys Gales Referring Phys: [id number] ANASTASSIA DOUTOVA IMPRESSIONS  1. Left ventricular ejection fraction, by estimation, is 60 to 65%. The left ventricle has normal function. The left ventricle has no regional wall motion abnormalities. Left ventricular diastolic parameters were normal.  2. Right ventricular systolic function is normal. The right  ventricular size is normal.  3. The mitral valve is normal in structure. No evidence of mitral valve regurgitation. No evidence of mitral stenosis.  4. The aortic valve is normal in structure. Aortic valve regurgitation is not visualized. No aortic stenosis is present.  5. The inferior vena cava is normal in size with greater than 50% respiratory variability, suggesting right atrial pressure of 3 mmHg. FINDINGS  Left Ventricle: Left ventricular ejection fraction, by estimation, is 60 to 65%. The left ventricle has normal function. The left ventricle has no regional wall motion abnormalities. The left ventricular internal cavity size was normal in size. There is  no left ventricular hypertrophy. Left ventricular diastolic parameters were normal. Right Ventricle: The right ventricular size is normal. No increase in right ventricular wall thickness. Right ventricular systolic function is normal. Left Atrium: Left atrial size was normal in size. Right Atrium: Right atrial size was normal in size. Pericardium: There is no evidence of pericardial effusion. Mitral Valve: The mitral valve is normal in structure. No evidence of mitral valve regurgitation. No evidence of mitral valve stenosis. Tricuspid Valve: The tricuspid valve is  PROGRESS NOTE    Elaine Campbell  ZOX:[id number] DOB: April 12, 1932 DOA: 09/05/2023 PCP: Eliezer Lofts, MD    Brief Narrative:  89 year old from independent living, has history of chronic anemia, CKD stage IIIb, hypertension, brought to the emergency room with complaints of not feeling well, fatigue and tired for at least 3 days.  In the emergency room she was found to have temperature 93.7, she had abnormal urine.  She had decreased oral intake and was very nauseous.  Admitted with hypothermia and UTI.  Also noted to have AKI.  Subjective:  Patient seen and examined.  Today she denies any complaints.  She has not walked.  She is worried about unable to walk.  Afebrile.  Temperature has remained stable.  Nausea has improved. Hemoglobin 7.1.  Patient thinks it is chronic issues.  She does not want any procedure done. Patient consented for blood transfusion.  Will transfuse 1 unit of PRBC today. Overall remains stable.  Patient tells me that she is okay with some treatments and medications, she does not want any surgery or endoscopies. Discussed with patient as well as her power of attorney about disposition.  Patient will likely need permanently moving to ALF  Assessment & Plan:   Sepsis present on admission secondary to UTI.  Hypothermia.  AKI. Patient was adequately resuscitated with isotonic fluid.  Currently remains on bicarbonate infusion.  Blood pressures are adequate.  Temperature has been normalized. Continue Rocephin.  Pending urine cultures and blood cultures.negative so far. Continue supportive care.  AKI on CKD stage IIIb: Recent baseline creatinine of 1.27.  This is likely prerenal.  She has a Foley catheter in place.  Will renew bicarbonate infusion and continue for the time being.  Monitor closely.   Urine output about 650 mL last 24 hours. Continue Foley catheter today.  Anemia of chronic disease: Baseline known hemoglobin of 10.  Hemoglobin dropped to 8 and subsequently to  7.1.   BUN has responded to IV fluids.  She is positive for FOBT.  There is no clinical evidence of ongoing bleeding.  On Protonix.  Declined endoscopy by patient and her healthcare power of attorney.  Continue supportive measures.  B12, iron levels are adequate. Since patient is declining procedures, she is expected to drop her hemoglobin further.  As a supportive care, will transfuse 1 unit of PRBC today.  Patient consented.  Hyperkalemia: Improved.  Goal of care: Patient has clear goal of care with DNR and limited intervention.  She does not want any procedures.  She wants to be able to move around and go home. Patient will need assisted living facility level of care and can likely go back to current home in next 24 to 48 hours. Will send referral to palliative care for outpatient follow-up. Will involve palliative care for further discussion while in the hospital.   DVT prophylaxis: SCDs Start: 09/05/23 2022   Code Status: DNR-Limited intervention Family Communication: Healthcare power of attorney Mr. [name] on the phone. Disposition Plan: Status is: Inpatient Remains inpatient appropriate because: Severe acute illness.  Patient can transfer to MedSurg bed today.     Consultants:  Gastroenterology Palliative care  Procedures:  None  Antimicrobials:  Rocephin 10/27---     Objective: Vitals:   09/07/23 0800 09/07/23 0900 09/07/23 1000 09/07/23 1100  BP: (!) 117/59 135/85 (!) 150/108 (!) 147/56  Pulse: 64 73 68 61  Resp: 18 (!) 28 18 13   Temp: 98.2 F (36.8 C) 98.1 F (36.7 C) 97.7  Final   Bordetella Parapertussis NOT DETECTED NOT DETECTED Final   Chlamydophila pneumoniae NOT DETECTED NOT DETECTED Final   Mycoplasma pneumoniae NOT DETECTED NOT DETECTED Final    Comment: Performed at [facility], [address], [place], [place] [address]  MRSA Next Gen by PCR, Nasal     Status: None   Collection Time: 09/06/23 10:39 AM   Specimen: Nasal Mucosa; Nasal Swab  Result Value Ref Range Status   MRSA by PCR Next Gen NOT DETECTED NOT DETECTED Final    Comment: (NOTE) The GeneXpert MRSA Assay (FDA approved for NASAL specimens only), is one component of a comprehensive MRSA colonization surveillance program. It is not intended to diagnose MRSA infection nor to guide or monitor treatment for MRSA infections. Test performance is not FDA approved in patients less than 48 years old. Performed at [facility], [address], [place], [place] [address]          Radiology Studies: ECHOCARDIOGRAM COMPLETE  Result Date: 09/06/2023    ECHOCARDIOGRAM REPORT   Patient Name:   Elaine Campbell Date of Exam: 09/06/2023 Medical Rec #:  [id number]    Height:       63.0 in Accession #:    [id number]   Weight:       123.9 lb Date of Birth:  Mar 11, 1932     BSA:          1.578 m Patient Age:    89 years     BP:           140/42 mmHg Patient Gender: F            HR:           88 bpm. Exam Location:  Inpatient Procedure: 2D Echo, Color Doppler and Cardiac Doppler Indications:    Murmur  History:        Patient has no prior history of Echocardiogram examinations.                 Signs/Symptoms:Murmur.  Sonographer:    Darlys Gales Referring Phys: [id number] ANASTASSIA DOUTOVA IMPRESSIONS  1. Left ventricular ejection fraction, by estimation, is 60 to 65%. The left ventricle has normal function. The left ventricle has no regional wall motion abnormalities. Left ventricular diastolic parameters were normal.  2. Right ventricular systolic function is normal. The right  ventricular size is normal.  3. The mitral valve is normal in structure. No evidence of mitral valve regurgitation. No evidence of mitral stenosis.  4. The aortic valve is normal in structure. Aortic valve regurgitation is not visualized. No aortic stenosis is present.  5. The inferior vena cava is normal in size with greater than 50% respiratory variability, suggesting right atrial pressure of 3 mmHg. FINDINGS  Left Ventricle: Left ventricular ejection fraction, by estimation, is 60 to 65%. The left ventricle has normal function. The left ventricle has no regional wall motion abnormalities. The left ventricular internal cavity size was normal in size. There is  no left ventricular hypertrophy. Left ventricular diastolic parameters were normal. Right Ventricle: The right ventricular size is normal. No increase in right ventricular wall thickness. Right ventricular systolic function is normal. Left Atrium: Left atrial size was normal in size. Right Atrium: Right atrial size was normal in size. Pericardium: There is no evidence of pericardial effusion. Mitral Valve: The mitral valve is normal in structure. No evidence of mitral valve regurgitation. No evidence of mitral valve stenosis. Tricuspid Valve: The tricuspid valve is  Final   Bordetella Parapertussis NOT DETECTED NOT DETECTED Final   Chlamydophila pneumoniae NOT DETECTED NOT DETECTED Final   Mycoplasma pneumoniae NOT DETECTED NOT DETECTED Final    Comment: Performed at [facility], [address], [place], [place] [address]  MRSA Next Gen by PCR, Nasal     Status: None   Collection Time: 09/06/23 10:39 AM   Specimen: Nasal Mucosa; Nasal Swab  Result Value Ref Range Status   MRSA by PCR Next Gen NOT DETECTED NOT DETECTED Final    Comment: (NOTE) The GeneXpert MRSA Assay (FDA approved for NASAL specimens only), is one component of a comprehensive MRSA colonization surveillance program. It is not intended to diagnose MRSA infection nor to guide or monitor treatment for MRSA infections. Test performance is not FDA approved in patients less than 48 years old. Performed at [facility], [address], [place], [place] [address]          Radiology Studies: ECHOCARDIOGRAM COMPLETE  Result Date: 09/06/2023    ECHOCARDIOGRAM REPORT   Patient Name:   Elaine Campbell Date of Exam: 09/06/2023 Medical Rec #:  [id number]    Height:       63.0 in Accession #:    [id number]   Weight:       123.9 lb Date of Birth:  Mar 11, 1932     BSA:          1.578 m Patient Age:    89 years     BP:           140/42 mmHg Patient Gender: F            HR:           88 bpm. Exam Location:  Inpatient Procedure: 2D Echo, Color Doppler and Cardiac Doppler Indications:    Murmur  History:        Patient has no prior history of Echocardiogram examinations.                 Signs/Symptoms:Murmur.  Sonographer:    Darlys Gales Referring Phys: [id number] ANASTASSIA DOUTOVA IMPRESSIONS  1. Left ventricular ejection fraction, by estimation, is 60 to 65%. The left ventricle has normal function. The left ventricle has no regional wall motion abnormalities. Left ventricular diastolic parameters were normal.  2. Right ventricular systolic function is normal. The right  ventricular size is normal.  3. The mitral valve is normal in structure. No evidence of mitral valve regurgitation. No evidence of mitral stenosis.  4. The aortic valve is normal in structure. Aortic valve regurgitation is not visualized. No aortic stenosis is present.  5. The inferior vena cava is normal in size with greater than 50% respiratory variability, suggesting right atrial pressure of 3 mmHg. FINDINGS  Left Ventricle: Left ventricular ejection fraction, by estimation, is 60 to 65%. The left ventricle has normal function. The left ventricle has no regional wall motion abnormalities. The left ventricular internal cavity size was normal in size. There is  no left ventricular hypertrophy. Left ventricular diastolic parameters were normal. Right Ventricle: The right ventricular size is normal. No increase in right ventricular wall thickness. Right ventricular systolic function is normal. Left Atrium: Left atrial size was normal in size. Right Atrium: Right atrial size was normal in size. Pericardium: There is no evidence of pericardial effusion. Mitral Valve: The mitral valve is normal in structure. No evidence of mitral valve regurgitation. No evidence of mitral valve stenosis. Tricuspid Valve: The tricuspid valve is  Final   Bordetella Parapertussis NOT DETECTED NOT DETECTED Final   Chlamydophila pneumoniae NOT DETECTED NOT DETECTED Final   Mycoplasma pneumoniae NOT DETECTED NOT DETECTED Final    Comment: Performed at [facility], [address], [place], [place] [address]  MRSA Next Gen by PCR, Nasal     Status: None   Collection Time: 09/06/23 10:39 AM   Specimen: Nasal Mucosa; Nasal Swab  Result Value Ref Range Status   MRSA by PCR Next Gen NOT DETECTED NOT DETECTED Final    Comment: (NOTE) The GeneXpert MRSA Assay (FDA approved for NASAL specimens only), is one component of a comprehensive MRSA colonization surveillance program. It is not intended to diagnose MRSA infection nor to guide or monitor treatment for MRSA infections. Test performance is not FDA approved in patients less than 48 years old. Performed at [facility], [address], [place], [place] [address]          Radiology Studies: ECHOCARDIOGRAM COMPLETE  Result Date: 09/06/2023    ECHOCARDIOGRAM REPORT   Patient Name:   Elaine Campbell Date of Exam: 09/06/2023 Medical Rec #:  [id number]    Height:       63.0 in Accession #:    [id number]   Weight:       123.9 lb Date of Birth:  Mar 11, 1932     BSA:          1.578 m Patient Age:    89 years     BP:           140/42 mmHg Patient Gender: F            HR:           88 bpm. Exam Location:  Inpatient Procedure: 2D Echo, Color Doppler and Cardiac Doppler Indications:    Murmur  History:        Patient has no prior history of Echocardiogram examinations.                 Signs/Symptoms:Murmur.  Sonographer:    Darlys Gales Referring Phys: [id number] ANASTASSIA DOUTOVA IMPRESSIONS  1. Left ventricular ejection fraction, by estimation, is 60 to 65%. The left ventricle has normal function. The left ventricle has no regional wall motion abnormalities. Left ventricular diastolic parameters were normal.  2. Right ventricular systolic function is normal. The right  ventricular size is normal.  3. The mitral valve is normal in structure. No evidence of mitral valve regurgitation. No evidence of mitral stenosis.  4. The aortic valve is normal in structure. Aortic valve regurgitation is not visualized. No aortic stenosis is present.  5. The inferior vena cava is normal in size with greater than 50% respiratory variability, suggesting right atrial pressure of 3 mmHg. FINDINGS  Left Ventricle: Left ventricular ejection fraction, by estimation, is 60 to 65%. The left ventricle has normal function. The left ventricle has no regional wall motion abnormalities. The left ventricular internal cavity size was normal in size. There is  no left ventricular hypertrophy. Left ventricular diastolic parameters were normal. Right Ventricle: The right ventricular size is normal. No increase in right ventricular wall thickness. Right ventricular systolic function is normal. Left Atrium: Left atrial size was normal in size. Right Atrium: Right atrial size was normal in size. Pericardium: There is no evidence of pericardial effusion. Mitral Valve: The mitral valve is normal in structure. No evidence of mitral valve regurgitation. No evidence of mitral valve stenosis. Tricuspid Valve: The tricuspid valve is  Final   Bordetella Parapertussis NOT DETECTED NOT DETECTED Final   Chlamydophila pneumoniae NOT DETECTED NOT DETECTED Final   Mycoplasma pneumoniae NOT DETECTED NOT DETECTED Final    Comment: Performed at [facility], [address], [place], [place] [address]  MRSA Next Gen by PCR, Nasal     Status: None   Collection Time: 09/06/23 10:39 AM   Specimen: Nasal Mucosa; Nasal Swab  Result Value Ref Range Status   MRSA by PCR Next Gen NOT DETECTED NOT DETECTED Final    Comment: (NOTE) The GeneXpert MRSA Assay (FDA approved for NASAL specimens only), is one component of a comprehensive MRSA colonization surveillance program. It is not intended to diagnose MRSA infection nor to guide or monitor treatment for MRSA infections. Test performance is not FDA approved in patients less than 48 years old. Performed at [facility], [address], [place], [place] [address]          Radiology Studies: ECHOCARDIOGRAM COMPLETE  Result Date: 09/06/2023    ECHOCARDIOGRAM REPORT   Patient Name:   Elaine Campbell Date of Exam: 09/06/2023 Medical Rec #:  [id number]    Height:       63.0 in Accession #:    [id number]   Weight:       123.9 lb Date of Birth:  Mar 11, 1932     BSA:          1.578 m Patient Age:    89 years     BP:           140/42 mmHg Patient Gender: F            HR:           88 bpm. Exam Location:  Inpatient Procedure: 2D Echo, Color Doppler and Cardiac Doppler Indications:    Murmur  History:        Patient has no prior history of Echocardiogram examinations.                 Signs/Symptoms:Murmur.  Sonographer:    Darlys Gales Referring Phys: [id number] ANASTASSIA DOUTOVA IMPRESSIONS  1. Left ventricular ejection fraction, by estimation, is 60 to 65%. The left ventricle has normal function. The left ventricle has no regional wall motion abnormalities. Left ventricular diastolic parameters were normal.  2. Right ventricular systolic function is normal. The right  ventricular size is normal.  3. The mitral valve is normal in structure. No evidence of mitral valve regurgitation. No evidence of mitral stenosis.  4. The aortic valve is normal in structure. Aortic valve regurgitation is not visualized. No aortic stenosis is present.  5. The inferior vena cava is normal in size with greater than 50% respiratory variability, suggesting right atrial pressure of 3 mmHg. FINDINGS  Left Ventricle: Left ventricular ejection fraction, by estimation, is 60 to 65%. The left ventricle has normal function. The left ventricle has no regional wall motion abnormalities. The left ventricular internal cavity size was normal in size. There is  no left ventricular hypertrophy. Left ventricular diastolic parameters were normal. Right Ventricle: The right ventricular size is normal. No increase in right ventricular wall thickness. Right ventricular systolic function is normal. Left Atrium: Left atrial size was normal in size. Right Atrium: Right atrial size was normal in size. Pericardium: There is no evidence of pericardial effusion. Mitral Valve: The mitral valve is normal in structure. No evidence of mitral valve regurgitation. No evidence of mitral valve stenosis. Tricuspid Valve: The tricuspid valve is

## 2023-09-07 NOTE — TOC Initial Note (Signed)
Transition of Care [facility]) - Initial/Assessment Note    Patient Details  Name: Elaine Campbell MRN: [id number] Date of Birth: Apr 29, 1932  Transition of Care [facility]) CM/SW Contact:    Elaine Cleaver, LCSW Phone Number: 09/07/2023, 1:25 PM  Clinical Narrative:                  CSW was informed that patient needs short term rehab.  Patient lives at [facility].  CSW contacted Elaine Campbell at [facility], and she does not have any beds available today, but she is following patient, and see what she can do to find a bed for patient later in the week.  CSW contacted patient's POA Elaine Campbell [phone], and informed him of the update regarding beds.  CSW suggested that patient's information be sent to other SNFs in the area in case [facility] does not have a bed available later in the week.  Patient and her POA were agreeable to CSW sending information to other SNFs.  CSW sent referral to other SNFs.  TOC to continue to follow patient's progress throughout discharge planning.    Expected Discharge Plan: Skilled Nursing Facility Barriers to Discharge: Continued Medical Work up   Patient Goals and CMS Choice Patient states their goals for this hospitalization and ongoing recovery are:: To go to SNF for short term rehab then return back to [facility]. CMS Medicare.gov Compare Post Acute Care list provided to:: Patient Represenative (must comment) (Patient's HCPOA Elaine Campbell) Choice offered to / list presented to : [facility] POA / Guardian [employer] ownership interest in [facility].provided to:: [facility] POA / Guardian    Expected Discharge Plan and Services   SNF for short term rehab.   Post Acute Care Choice: Skilled Nursing Facility Living arrangements for the past 2 months: Independent Living Facility                                      Prior Living Arrangements/Services Living arrangements for the past 2 months: Independent Living Facility Lives  with:: Self Patient language and need for interpreter reviewed:: Yes Do you feel safe going back to the place where you live?: No   Patient needs to go to short term rehab prior to returning back home.  Need for Family Participation in Patient Care: No (Comment) Care giver support system in place?: No (comment)   Criminal Activity/Legal Involvement Pertinent to Current Situation/Hospitalization: No - Comment as needed  Activities of Daily Living   ADL Screening (condition at time of admission) Independently performs ADLs?: No Does the patient have a NEW difficulty with bathing/dressing/toileting/self-feeding that is expected to last >3 days?: Yes (Initiates electronic notice to provider for possible OT consult) Does the patient have a NEW difficulty with getting in/out of bed, walking, or climbing stairs that is expected to last >3 days?: Yes (Initiates electronic notice to provider for possible PT consult) Does the patient have a NEW difficulty with communication that is expected to last >3 days?: No Is the patient deaf or have difficulty hearing?: No Does the patient have difficulty seeing, even when wearing glasses/contacts?: No Does the patient have difficulty concentrating, remembering, or making decisions?: No  Permission Sought/Granted Permission sought to share information with : Case Manager, Magazine features editor, Family Supports Permission granted to share information with : Yes, Verbal Permission Granted, Yes, Release of Information Signed  Share  Information with NAME: Elaine Campbell   [phone]  Permission granted to share info w AGENCY: SNF admissions        Emotional Assessment Appearance:: Appears stated age Attitude/Demeanor/Rapport: Engaged Affect (typically observed): Accepting, Appropriate, Calm, Stable Orientation: : Oriented to Self, Oriented to Place, Oriented to  Time, Oriented to Situation Alcohol / Substance Use: Not Applicable Psych  Involvement: No (comment)  Admission diagnosis:  AKI (acute kidney injury) (HCC) [N17.9] Sepsis (HCC) [A41.9] Fatigue, unspecified type [R53.83] Patient Active Problem List   Diagnosis Date Noted   AKI (acute kidney injury) (HCC) 09/05/2023   Sepsis (HCC) 09/05/2023   Occult blood in stools 09/05/2023   Hypothermia 09/05/2023   UTI (urinary tract infection) 09/05/2023   Hyponatremia 09/05/2023   Hyperkalemia 09/05/2023   Metabolic acidosis 09/05/2023   Gait abnormality 04/20/2023   Peripheral edema 04/20/2023   HTN (hypertension) 04/09/2023   CKD (chronic kidney disease) stage 3, GFR 30-59 ml/min (HCC) 04/09/2023   Anemia 04/09/2023   Left ankle pain 04/09/2023   PCP:  Elaine Lofts, MD Pharmacy:   [facility] [address] [place] - [address] [place] [address] Phone: [phone] Fax: [phone]     Social Determinants of Health (SDOH) Social History: SDOH Screenings   Food Insecurity: No Food Insecurity (09/05/2023)  Housing: Low Risk  (09/05/2023)  Transportation Needs: No Transportation Needs (09/05/2023)  Utilities: Not At Risk (09/05/2023)  Tobacco Use: Low Risk  (09/05/2023)   SDOH Interventions:     Readmission Risk Interventions     No data to display

## 2023-09-07 NOTE — NC FL2 (Signed)
[place] MEDICAID FL2 LEVEL OF CARE FORM     IDENTIFICATION  Patient Name: Elaine Campbell Birthdate: 1932-02-19 Sex: female Admission Date (Current Location): 09/05/2023  [facility] and [place] Number:  Producer, television/film/video and Address:  [facility],  [address] [place], [place] [address]      Provider Number: [id number]  Attending Physician Name and Address:  Dorcas Carrow, MD  Relative Name and Phone Number:  Lenox Ponds   [phone]    Current Level of Care: Hospital Recommended Level of Care: Skilled Nursing Facility Prior Approval Number:    Date Approved/Denied:   PASRR Number: [id number]  Discharge Plan: SNF    Current Diagnoses: Patient Active Problem List   Diagnosis Date Noted   AKI (acute kidney injury) (HCC) 09/05/2023   Sepsis (HCC) 09/05/2023   Occult blood in stools 09/05/2023   Hypothermia 09/05/2023   UTI (urinary tract infection) 09/05/2023   Hyponatremia 09/05/2023   Hyperkalemia 09/05/2023   Metabolic acidosis 09/05/2023   Gait abnormality 04/20/2023   Peripheral edema 04/20/2023   HTN (hypertension) 04/09/2023   CKD (chronic kidney disease) stage 3, GFR 30-59 ml/min (HCC) 04/09/2023   Anemia 04/09/2023   Left ankle pain 04/09/2023    Orientation RESPIRATION BLADDER Height & Weight     Self, Time, Place, Situation  Normal Continent Weight: 123 lb 14.4 oz (56.2 kg) Height:  5' 2.99" (160 cm)  BEHAVIORAL SYMPTOMS/MOOD NEUROLOGICAL BOWEL NUTRITION STATUS      Continent    AMBULATORY STATUS COMMUNICATION OF NEEDS Skin   Limited Assist Verbally Normal                       Personal Care Assistance Level of Assistance  Feeding, Dressing, Bathing Bathing Assistance: Limited assistance Feeding assistance: Independent Dressing Assistance: Limited assistance     Functional Limitations Info  Hearing, Speech, Sight Sight Info: Adequate Hearing Info: Adequate Speech Info: Adequate    SPECIAL CARE FACTORS FREQUENCY   PT (By licensed PT), OT (By licensed OT)     PT Frequency: Minimum 5x a week OT Frequency: Minimum 5x a week            Contractures Contractures Info: Not present    Additional Factors Info  Code Status, Allergies Code Status Info: DNR Allergies Info: Amoxicillin  Ivp Dye (Iodinated Contrast Media)           Current Medications (09/07/2023):  This is the current hospital active medication list Current Facility-Administered Medications  Medication Dose Route Frequency Provider Last Rate Last Admin   0.9 %  sodium chloride infusion (Manually program via Guardrails IV Fluids)   Intravenous Once Dorcas Carrow, MD       acetaminophen (TYLENOL) tablet 650 mg  650 mg Oral Q6H PRN Therisa Doyne, MD       Or   acetaminophen (TYLENOL) suppository 650 mg  650 mg Rectal Q6H PRN Doutova, Anastassia, MD       cefTRIAXone (ROCEPHIN) 1 g in sodium chloride 0.9 % 100 mL IVPB  1 g Intravenous Q24H Therisa Doyne, MD   Stopped at 09/06/23 1729   Chlorhexidine Gluconate Cloth 2 % PADS 6 each  6 each Topical Daily Doutova, Anastassia, MD   6 each at 09/06/23 1657   diclofenac Sodium (VOLTAREN) 1 % topical gel 4 g  4 g Topical QID Dorcas Carrow, MD   4 g at 09/07/23 0915   feeding supplement (KATE FARMS STANDARD 1.4) liquid 325 mL  325  mL Oral Daily Dorcas Carrow, MD   325 mL at 09/07/23 1157   folic acid (FOLVITE) tablet 1 mg  1 mg Oral Daily Doutova, Jonny Ruiz, MD   1 mg at 09/07/23 1610   HYDROcodone-acetaminophen (NORCO/VICODIN) 5-325 MG per tablet 1-2 tablet  1-2 tablet Oral Q4H PRN Therisa Doyne, MD       multivitamin with minerals tablet 1 tablet  1 tablet Oral Daily Doutova, Anastassia, MD   1 tablet at 09/07/23 0914   ondansetron (ZOFRAN) tablet 4 mg  4 mg Oral Q6H PRN Therisa Doyne, MD       Or   ondansetron (ZOFRAN) injection 4 mg  4 mg Intravenous Q6H PRN Doutova, Anastassia, MD       Oral care mouth rinse  15 mL Mouth Rinse PRN Doutova, Anastassia, MD        pantoprazole (PROTONIX) injection 40 mg  40 mg Intravenous Q12H Doutova, Anastassia, MD   40 mg at 09/07/23 9604   thiamine (VITAMIN B1) injection 100 mg  100 mg Intravenous Q24H Therisa Doyne, MD   100 mg at 09/06/23 2223     Discharge Medications: Please see discharge summary for a list of discharge medications.  Relevant Imaging Results:  Relevant Lab Results:   Additional Information SSN [id number]  Darleene Cleaver, LCSW

## 2023-09-08 DIAGNOSIS — Z7189 Other specified counseling: Secondary | ICD-10-CM

## 2023-09-08 DIAGNOSIS — Z515 Encounter for palliative care: Secondary | ICD-10-CM

## 2023-09-08 DIAGNOSIS — R195 Other fecal abnormalities: Secondary | ICD-10-CM | POA: Diagnosis not present

## 2023-09-08 DIAGNOSIS — N1831 Chronic kidney disease, stage 3a: Secondary | ICD-10-CM | POA: Diagnosis not present

## 2023-09-08 DIAGNOSIS — D649 Anemia, unspecified: Secondary | ICD-10-CM | POA: Diagnosis not present

## 2023-09-08 DIAGNOSIS — N179 Acute kidney failure, unspecified: Secondary | ICD-10-CM | POA: Diagnosis not present

## 2023-09-08 LAB — BASIC METABOLIC PANEL
Anion gap: 7 (ref 5–15)
BUN: 89 mg/dL — ABNORMAL HIGH (ref 8–23)
CO2: 20 mmol/L — ABNORMAL LOW (ref 22–32)
Calcium: 7.6 mg/dL — ABNORMAL LOW (ref 8.9–10.3)
Chloride: 110 mmol/L (ref 98–111)
Creatinine, Ser: 2.14 mg/dL — ABNORMAL HIGH (ref 0.44–1.00)
GFR, Estimated: 22 mL/min — ABNORMAL LOW (ref >60)
Glucose, Bld: 106 mg/dL — ABNORMAL HIGH (ref 70–99)
Potassium: 4 mmol/L (ref 3.5–5.1)
Sodium: 137 mmol/L (ref 135–145)

## 2023-09-08 LAB — CBC WITH DIFFERENTIAL/PLATELET
Abs Immature Granulocytes: 0.05 10*3/uL (ref 0.00–0.07)
Basophils Absolute: 0 10*3/uL (ref 0.0–0.1)
Basophils Relative: 0 %
Eosinophils Absolute: 0.2 10*3/uL (ref 0.0–0.5)
Eosinophils Relative: 3 %
HCT: 24.3 % — ABNORMAL LOW (ref 36.0–46.0)
Hemoglobin: 8.3 g/dL — ABNORMAL LOW (ref 12.0–15.0)
Immature Granulocytes: 1 %
Lymphocytes Relative: 22 %
Lymphs Abs: 1 10*3/uL (ref 0.7–4.0)
MCH: 32.3 pg (ref 26.0–34.0)
MCHC: 34.2 g/dL (ref 30.0–36.0)
MCV: 94.6 fL (ref 80.0–100.0)
Monocytes Absolute: 0.7 10*3/uL (ref 0.1–1.0)
Monocytes Relative: 16 %
Neutro Abs: 2.6 10*3/uL (ref 1.7–7.7)
Neutrophils Relative %: 58 %
Platelets: 182 10*3/uL (ref 150–400)
RBC: 2.57 MIL/uL — ABNORMAL LOW (ref 3.87–5.11)
RDW: 17.2 % — ABNORMAL HIGH (ref 11.5–15.5)
WBC: 4.5 10*3/uL (ref 4.0–10.5)
nRBC: 0 % (ref 0.0–0.2)

## 2023-09-08 LAB — TYPE AND SCREEN
ABO/RH(D): O NEG
Antibody Screen: NEGATIVE
Unit division: 0

## 2023-09-08 LAB — URINE CULTURE: Culture: >=100000 — AB

## 2023-09-08 LAB — BPAM RBC
Blood Product Expiration Date: 202411102359
ISSUE DATE / TIME: 202410291318
Unit Type and Rh: 9500

## 2023-09-08 MED ORDER — THIAMINE MONONITRATE 100 MG PO TABS
100.0000 mg | ORAL_TABLET | Freq: Every day | ORAL | Status: DC
  Administered 2023-09-08: 100 mg via ORAL
  Filled 2023-09-08: qty 1

## 2023-09-08 MED ORDER — SODIUM CHLORIDE 0.9% FLUSH
10.0000 mL | Freq: Two times a day (BID) | INTRAVENOUS | Status: DC
  Administered 2023-09-08 – 2023-09-09 (×3): 10 mL via INTRAVENOUS

## 2023-09-08 NOTE — Plan of Care (Signed)
  Problem: Clinical Measurements: Goal: Ability to maintain clinical measurements within normal limits will improve Outcome: Progressing Goal: Will remain free from infection Outcome: Progressing   Problem: Nutrition: Goal: Adequate nutrition will be maintained Outcome: Progressing   Problem: Elimination: Goal: Will not experience complications related to urinary retention Outcome: Progressing   Problem: Pain Management: Goal: General experience of comfort will improve Outcome: Progressing   Problem: Safety: Goal: Ability to remain free from injury will improve Outcome: Progressing   Problem: Skin Integrity: Goal: Risk for impaired skin integrity will decrease Outcome: Progressing

## 2023-09-08 NOTE — Progress Notes (Signed)
[facility]  Hospice hospital liaison note  Referral received for hospice services upon d/c to [facility]. Unable to reach caregiver this afternoon. Will reach out first thing tomorrow for explanation of services.   Please don't hesitate to reach out for hospice related questions or concerns.  Thea Gist, BSN RN Hospice hospital liaison   [phone]

## 2023-09-08 NOTE — Plan of Care (Signed)
  Problem: Education: Goal: Knowledge of General Education information will improve Description: Including pain rating scale, medication(s)/side effects and non-pharmacologic comfort measures Outcome: Progressing   Problem: Health Behavior/Discharge Planning: Goal: Ability to manage health-related needs will improve Outcome: Progressing   Problem: Clinical Measurements: Goal: Ability to maintain clinical measurements within normal limits will improve Outcome: Progressing Goal: Will remain free from infection Outcome: Progressing Goal: Diagnostic test results will improve Outcome: Progressing Goal: Respiratory complications will improve Outcome: Progressing Goal: Cardiovascular complication will be avoided Outcome: Progressing   Problem: Activity: Goal: Risk for activity intolerance will decrease Outcome: Progressing   Problem: Nutrition: Goal: Adequate nutrition will be maintained Outcome: Progressing   Problem: Coping: Goal: Level of anxiety will decrease Outcome: Progressing   Problem: Elimination: Goal: Will not experience complications related to bowel motility Outcome: Progressing Goal: Will not experience complications related to urinary retention Outcome: Progressing   Problem: Pain Management: Goal: General experience of comfort will improve Outcome: Progressing   Problem: Safety: Goal: Ability to remain free from injury will improve Outcome: Progressing   Problem: Skin Integrity: Goal: Risk for impaired skin integrity will decrease Outcome: Progressing   Problem: Fluid Volume: Goal: Hemodynamic stability will improve Outcome: Progressing   Problem: Clinical Measurements: Goal: Signs and symptoms of infection will decrease Outcome: Progressing   Problem: Respiratory: Goal: Ability to maintain adequate ventilation will improve Outcome: Progressing

## 2023-09-08 NOTE — Consult Note (Signed)
Consultation Note Date: 09/08/2023   Patient Name: Elaine Campbell  DOB: Aug 31, 1932  MRN: [id number]  Age / Sex: 89 y.o., female  PCP: Eliezer Lofts, MD Referring Physician: Dorcas Carrow, MD  Reason for Consultation:  Goals of care  HPI/Patient Profile: 89 y.o. female  with past medical history of chronic anemia, CKD IIIb, HTN, no recent hospitalizations admitted on 09/05/2023 with malaise, fatigue. Workup revealed sepsis due to UTI and anemia- she received one unit of PRBC's. FOBT positive. She declined further workup for anemia. Palliative consulted for GOC.   Primary Decision Maker PATIENT - with support from her HCPOA [name]  Discussion: Chart reviewed including labs, progress notes, imaging from this and previous encounters.  Prior to admission [name] was living at [facility] in independent living.  She is having a difficult time recovering from this insult.  On eval she is awake, alert and oriented.  She shared that her HCPOA [name] would want to be present for any discussion. She also urgently needed to have a bowel movement. She declined any pain or other symptoms.  I called [name] to arrange time to meet.  [name] confirmed that he is [name]'s HCPOA. He was also [name]'s brother's HCPOA.  [name] shared that plan for [name] has already been established and he is waiting to hear from [facility] for services at [facility]. He is preparing for her to be discharged tomorrow morning and he is unable to meet today.  Through discussion with [name] and social work they have decided patient will go to [facility] other campus and hope to receive some physical therapy there and eventually transition back to her home campus when a bed is available. He understands [name] does not want any further workup and just wants to be home and comfortable.   SUMMARY OF RECOMMENDATIONS -GOC- no further workup, pt likely to  continue to decline with symptomatic anemia- she has been referred to [facility], likely to discharge tomorrow morning -No further inpt Palliative needs    Code Status/Advance Care Planning: DNR   Prognosis:   Unable to determine  Discharge Planning: To Be Determined  Primary Diagnoses: Present on Admission:  AKI (acute kidney injury) (HCC)  Sepsis (HCC)  CKD (chronic kidney disease) stage 3, GFR 30-59 ml/min (HCC)  HTN (hypertension)  Anemia  Occult blood in stools  Hypothermia  UTI (urinary tract infection)  Hyponatremia  Hyperkalemia  Metabolic acidosis   Review of Systems  Physical Exam  Vital Signs: BP (!) 155/64 (BP Location: Left Arm)   Pulse 68   Temp 98.3 F (36.8 C) (Oral)   Resp 16   Ht 5' 2.99" (1.6 m)   Wt 56.2 kg   SpO2 96%   BMI 21.95 kg/m  Pain Scale: 0-10 POSS *See Group Information*: S-Acceptable,Sleep, easy to arouse Pain Score: 0-No pain   SpO2: SpO2: 96 % O2 Device:SpO2: 96 % O2 Flow Rate: .   IO: Intake/output summary:  Intake/Output Summary (Last 24 hours) at 09/08/2023 1524 Last data filed at 09/07/2023  2220 Gross per 24 hour  Intake 845.94 ml  Output 600 ml  Net 245.94 ml    LBM: Last BM Date :  (PTA) Baseline Weight: Weight: 56.2 kg Most recent weight: Weight: 56.2 kg       Thank you for this consult. Palliative medicine will continue to follow and assist as needed.  Time Total: 60 mins Signed by: Ocie Bob, AGNP-C Palliative Medicine  Time includes:   Preparing to see the patient (e.g., review of tests) Obtaining and/or reviewing separately obtained history Performing a medically necessary appropriate examination and/or evaluation Counseling and educating the patient/family/caregiver Ordering medications, tests, or procedures Referring and communicating with other health care professionals (when not reported separately) Documenting clinical information in the electronic or other health record Independently  interpreting results (not reported separately) and communicating results to the patient/family/caregiver Care coordination (not reported separately) Clinical documentation   Please contact Palliative Medicine Team phone at [phone] for questions and concerns.  For individual provider: See Loretha Stapler

## 2023-09-08 NOTE — TOC Progression Note (Signed)
Transition of Care [facility]) - Progression Note    Patient Details  Name: Elaine Campbell MRN: [id number] Date of Birth: 1932-07-14  Transition of Care [facility]) CM/SW Contact  Otelia Santee, LCSW Phone Number: 09/08/2023, 3:16 PM  Clinical Narrative:    Met with pt and friend at bedside to discuss recommendation for hospice services. Pt is agreeable with referral being made and has selected [facility] as her hospice agency of choice. Referral made to [facility] with [facility].  CSW spoke with Florentina Addison at [facility] who shares that they do not have SNF beds at [facility] at this time however, has spoken to pt about coming to [facility] for SNF and then returning to [facility] once SNF complete. CSW confirmed this plan with pt.  CSW left VM for Bobbeta at [facility] to determine when facility is able to accept pt.    Expected Discharge Plan: Skilled Nursing Facility Barriers to Discharge: Continued Medical Work up  Expected Discharge Plan and Services     Post Acute Care Choice: Skilled Nursing Facility Living arrangements for the past 2 months: Independent Living Facility                                       Social Determinants of Health (SDOH) Interventions SDOH Screenings   Food Insecurity: No Food Insecurity (09/05/2023)  Housing: Low Risk  (09/05/2023)  Transportation Needs: No Transportation Needs (09/05/2023)  Utilities: Not At Risk (09/05/2023)  Tobacco Use: Low Risk  (09/05/2023)    Readmission Risk Interventions    09/08/2023    3:16 PM  Readmission Risk Prevention Plan  Post Dischage Appt Complete  Medication Screening Complete  Transportation Screening Complete

## 2023-09-08 NOTE — Progress Notes (Signed)
PT Cancellation Note  Patient Details Name: Elaine Campbell MRN: [id number] DOB: January 08, 1932   Cancelled Treatment:    Reason Eval/Treat Not Completed: Patient declined, no reason specified; refuses any and all movement d/t "not feeling well" and "I can't find the start button today"   [facility] 09/08/2023, 12:28 PM

## 2023-09-08 NOTE — Progress Notes (Signed)
PROGRESS NOTE    Elaine Campbell  OZH:[id number] DOB: 12/09/1931 DOA: 09/05/2023 PCP: Eliezer Lofts, MD    Brief Narrative:  89 year old from independent living, has history of chronic anemia, CKD stage IIIb, hypertension, brought to the emergency room with complaints of not feeling well, fatigue and tired for at least 3 days.  In the emergency room she was found to have temperature 93.7, she had abnormal urine.  She had decreased oral intake and was very nauseous.  Admitted with hypothermia and UTI.  Also noted to have AKI.  Subjective:  Patient seen and examined.  Patient seriously talks about things going downhill for her.  She tells me that she does not have any start button.  Patient thinks that this may be her end and she is happy to take it. Quite depressed today. Renal functions still elevated.  She does not think she can urinate if I take her Foley catheter out. Discussed about how she wants to proceed further.  Also discussed with her that going back to parents home with hospice in place and not coming back to hospital is also an option and she is agreeable to that. Notified case management.  I will involve palliative care team to coordinate.  She may benefit with hospice at ALF.   Assessment & Plan:   Sepsis present on admission secondary to UTI.  Hypothermia.  AKI. Adequately resuscitated.  Blood cultures negative.  Urine cultures growing E. coli.  Continue Rocephin today.  AKI on CKD stage IIIb: Recent baseline creatinine of 1.27.  This is likely prerenal.  She has a Foley catheter in place.   Creatinine is still elevated to more than 2. Will discontinue further IV fluids.  Monitor on oral intake. Continue Foley catheter today.  See below discussion.  Anemia of chronic disease: Baseline known hemoglobin of 10.  Hemoglobin dropped to 8 and subsequently to 7.1.   She is positive for FOBT.  There is no clinical evidence of ongoing active bleeding.  On Protonix.  Declined  endoscopy by patient and her healthcare power of attorney.  Continue supportive measures.  B12, iron levels are adequate. 1 unit PRBC transfusion 10/29.  Appropriately responded.  Hyperkalemia: Improved.  Goal of care: Patient has clear goal of care with DNR and limited intervention.  She does not want any procedures.   Patient thinks that she may never be able to get up and walk independently.  She thinks she may want to spend end-of-life at friend's home.   She is agreeable to talk to palliative and also agreeable for hospice referral. Anticipate friends home when they have bed available.     DVT prophylaxis: SCDs Start: 09/05/23 2022   Code Status: DNR-Limited intervention Family Communication: Healthcare power of attorney Mr. [name] on the phone. Disposition Plan: Status is: Inpatient Remains inpatient appropriate because: Severe acute illness.  IV antibiotics     Consultants:  Gastroenterology Palliative care  Procedures:  None  Antimicrobials:  Rocephin 10/27---     Objective: Vitals:   09/07/23 1800 09/07/23 2008 09/08/23 0444 09/08/23 1216  BP: (!) 163/65 (!) 156/70 (!) 155/69 (!) 155/64  Pulse: (!) 59 66 (!) 57 68  Resp: (!) 9  16 16   Temp:  97.6 F (36.4 C) (!) 97.4 F (36.3 C) 98.3 F (36.8 C)  TempSrc:  Oral Oral Oral  SpO2: 96% 99% 97% 96%  Weight:      Height:        Intake/Output Summary (Last 24  hours) at 09/08/2023 1254 Last data filed at 09/07/2023 2220 Gross per 24 hour  Intake 981.31 ml  Output 900 ml  Net 81.31 ml   Filed Weights   09/05/23 2000  Weight: 56.2 kg    Examination:  General exam: Frail and debilitated.  She looks comfortable but she is anxious today. Respiratory system: Clear to auscultation.  No added sounds. Cardiovascular system: S1 & S2 heard, RRR.  No pedal edema. Gastrointestinal system: Soft.  Nontender.  Foley catheter with clear urine. Psychiatry: Judgement and insight appear normal.     Data  Reviewed: I have personally reviewed following labs and imaging studies  CBC: Recent Labs  Lab 09/05/23 1411 09/05/23 2047 09/06/23 0259 09/07/23 0326 09/08/23 0604  WBC 9.4 8.1 7.2 6.7 4.5  NEUTROABS 8.0*  --   --  5.9 2.6  HGB 9.4* 8.8* 8.0* 7.1* 8.3*  HCT 27.3* 27.4* 23.6* 20.6* 24.3*  MCV 92.5 100.7* 97.9 93.6 94.6  PLT 162 156 148* 177 182   Basic Metabolic Panel: Recent Labs  Lab 09/05/23 2047 09/06/23 0259 09/06/23 0934 09/07/23 0326 09/08/23 0604  NA 132* 134* 134* 137 137  K 5.3* 5.4* 5.1 4.6 4.0  CL 112* 115* 113* 112* 110  CO2 11* 14* 15* 17* 20*  GLUCOSE 86 102* 117* 144* 106*  BUN 110* 85* 97* 96* 89*  CREATININE 2.02* 2.06* 2.15* 2.09* 2.14*  CALCIUM 8.1* 7.8* 7.7* 7.6* 7.6*  MG 2.1 2.2  --  2.1  --   PHOS 4.5 4.4  --  3.1  --    GFR: Estimated Creatinine Clearance: 14.7 mL/min (A) (by C-G formula based on SCr of 2.14 mg/dL (H)). Liver Function Tests: Recent Labs  Lab 09/05/23 1411 09/06/23 0259 09/07/23 0326  AST 23 20 22   ALT 27 24 25   ALKPHOS 52 49 44  BILITOT 0.2* 0.9 0.7  PROT 5.5* 4.2* 4.1*  ALBUMIN 3.0* 2.0* 1.8*   Recent Labs  Lab 09/05/23 1411  LIPASE 69*   Recent Labs  Lab 09/05/23 2047  AMMONIA 20   Coagulation Profile: Recent Labs  Lab 09/06/23 0259  INR 1.2   Cardiac Enzymes: Recent Labs  Lab 09/05/23 2047  CKTOTAL 102   BNP (last 3 results) No results for input(s): "PROBNP" in the last 8760 hours. HbA1C: No results for input(s): "HGBA1C" in the last 72 hours. CBG: Recent Labs  Lab 09/06/23 2344 09/07/23 0342 09/07/23 0757 09/07/23 1255 09/07/23 1525  GLUCAP 144* 146* 128* 132* 115*   Lipid Profile: No results for input(s): "CHOL", "HDL", "LDLCALC", "TRIG", "CHOLHDL", "LDLDIRECT" in the last 72 hours. Thyroid Function Tests: Recent Labs    09/05/23 2047  TSH 3.388  FREET4 0.91   Anemia Panel: Recent Labs    09/05/23 2047  VITAMINB12 1,073*  FOLATE 15.8  FERRITIN 237  TIBC 259  IRON 55   RETICCTPCT 1.6   Sepsis Labs: Recent Labs  Lab 09/05/23 1800  LATICACIDVEN 0.4*    Recent Results (from the past 240 hour(s))  Culture, blood (routine x 2)     Status: None (Preliminary result)   Collection Time: 09/05/23  8:37 PM   Specimen: BLOOD  Result Value Ref Range Status   Specimen Description   Final    BLOOD BLOOD LEFT ARM AEROBIC BOTTLE ONLY Performed at [facility], [address], [place], [place] [address]    Special Requests   Final    Blood Culture adequate volume BOTTLES DRAWN AEROBIC ONLY Performed at [facility], [address], [place], [place] [address]    Culture   Final    NO GROWTH 3 DAYS Performed at [facility], [address] [place], [place] [address]    Report Status PENDING  Incomplete  Culture, blood (routine x 2)     Status: None (Preliminary result)   Collection Time: 09/05/23  8:47 PM   Specimen: BLOOD  Result Value Ref Range Status   Specimen Description   Final    BLOOD BLOOD LEFT ARM AEROBIC BOTTLE ONLY Performed at [facility], [address] [place], [place] [address]    Special Requests   Final    Blood Culture adequate volume BOTTLES DRAWN AEROBIC ONLY Performed at [facility], [address] [place], [place] [address]    Culture   Final    NO GROWTH 3 DAYS Performed at [facility], [address], [place], [place] [address]    Report Status PENDING  Incomplete  Urine Culture (for pregnant, neutropenic or urologic patients or patients with an indwelling urinary catheter)     Status: Abnormal (Preliminary result)   Collection Time: 09/05/23 10:13 PM   Specimen: Urine, Clean Catch  Result Value Ref Range Status   Specimen Description   Final    URINE, CLEAN CATCH Performed at [facility], [address] [place], [place] [address]    Special Requests   Final    NONE Performed at [facility], [address], [place], [place] [address]    Culture >=100,000 COLONIES/mL ESCHERICHIA COLI (A)  Final   Report Status PENDING  Incomplete  SARS Coronavirus 2 by RT PCR (hospital order, performed in [facility] hospital lab) *cepheid single result test* Anterior Nasal Swab     Status: None   Collection Time: 09/05/23 10:13 PM   Specimen: Anterior Nasal Swab  Result Value Ref Range Status   SARS Coronavirus 2 by RT PCR NEGATIVE NEGATIVE Final    Comment: Performed at [facility], [address] [place], [place] [address]  Respiratory (~20 pathogens) panel by PCR     Status: None   Collection Time: 09/05/23 10:13 PM   Specimen: Nasopharyngeal Swab; Respiratory  Result Value Ref Range Status   Adenovirus NOT DETECTED NOT DETECTED Final   Coronavirus 229E NOT DETECTED NOT DETECTED Final    Comment: (NOTE) The Coronavirus on the Respiratory Panel, DOES NOT test for the novel  Coronavirus (2019 nCoV)    Coronavirus HKU1 NOT DETECTED NOT DETECTED Final   Coronavirus NL63 NOT DETECTED NOT DETECTED Final   Coronavirus OC43 NOT DETECTED NOT DETECTED Final   Metapneumovirus NOT DETECTED NOT DETECTED Final   Rhinovirus / Enterovirus NOT DETECTED NOT DETECTED Final   Influenza A NOT DETECTED NOT DETECTED Final   Influenza B NOT DETECTED NOT DETECTED Final   Parainfluenza Virus 1 NOT DETECTED NOT DETECTED Final   Parainfluenza Virus 2 NOT DETECTED NOT DETECTED Final   Parainfluenza Virus 3 NOT DETECTED NOT DETECTED Final   Parainfluenza Virus 4 NOT DETECTED NOT DETECTED Final   Respiratory Syncytial Virus NOT DETECTED NOT DETECTED Final   Bordetella pertussis NOT DETECTED NOT DETECTED Final   Bordetella Parapertussis NOT DETECTED NOT DETECTED Final   Chlamydophila pneumoniae NOT DETECTED NOT DETECTED Final   Mycoplasma pneumoniae NOT DETECTED NOT DETECTED Final    Comment: Performed at [facility], [address], [place], [place] [address]  MRSA Next Gen by PCR,  Nasal     Status: None   Collection Time: 09/06/23 10:39 AM   Specimen: Nasal Mucosa; Nasal Swab  Result Value Ref Range Status   MRSA by PCR Next Gen NOT DETECTED NOT DETECTED Final    Comment: (NOTE) The GeneXpert MRSA Assay (FDA approved for NASAL specimens only), is one component of a comprehensive MRSA colonization surveillance program. It is not intended to diagnose MRSA infection nor to guide or monitor treatment for MRSA infections. Test performance is not FDA approved in patients less than 66 years old. Performed at [facility], [address], [place], [place] [address]          Radiology Studies: No results found.      Scheduled Meds:  Chlorhexidine Gluconate Cloth  6 each Topical Daily   diclofenac Sodium  4 g Topical QID   feeding supplement (KATE FARMS STANDARD 1.4)  325 mL Oral Daily   folic acid  1 mg Oral Daily   multivitamin with minerals  1 tablet Oral Daily   pantoprazole (PROTONIX) IV  40 mg Intravenous Q12H   sodium chloride flush  10 mL Intravenous Q12H   thiamine  100 mg Oral QHS   Continuous Infusions:  cefTRIAXone (ROCEPHIN)  IV Stopped (09/07/23 1750)     LOS: 3 days    Time spent: 35 minutes    Dorcas Carrow, MD [facility]

## 2023-09-09 DIAGNOSIS — N179 Acute kidney failure, unspecified: Secondary | ICD-10-CM | POA: Diagnosis not present

## 2023-09-09 LAB — BASIC METABOLIC PANEL
Anion gap: 4 — ABNORMAL LOW (ref 5–15)
BUN: 81 mg/dL — ABNORMAL HIGH (ref 8–23)
CO2: 19 mmol/L — ABNORMAL LOW (ref 22–32)
Calcium: 7.8 mg/dL — ABNORMAL LOW (ref 8.9–10.3)
Chloride: 115 mmol/L — ABNORMAL HIGH (ref 98–111)
Creatinine, Ser: 1.96 mg/dL — ABNORMAL HIGH (ref 0.44–1.00)
GFR, Estimated: 24 mL/min — ABNORMAL LOW (ref >60)
Glucose, Bld: 102 mg/dL — ABNORMAL HIGH (ref 70–99)
Potassium: 4.4 mmol/L (ref 3.5–5.1)
Sodium: 138 mmol/L (ref 135–145)

## 2023-09-09 MED ORDER — ACETAMINOPHEN 325 MG PO TABS: 650.0000 mg | ORAL_TABLET | Freq: Four times a day (QID) | ORAL | Status: DC | PRN

## 2023-09-09 MED ORDER — SODIUM CHLORIDE 0.9 % IV SOLN
1.0000 g | Freq: Once | INTRAVENOUS | Status: AC
  Administered 2023-09-09: 1 g via INTRAVENOUS
  Filled 2023-09-09: qty 10

## 2023-09-09 MED ORDER — CEPHALEXIN 500 MG PO CAPS: 500.0000 mg | ORAL_CAPSULE | Freq: Two times a day (BID) | ORAL | Status: AC

## 2023-09-09 MED ORDER — DICLOFENAC SODIUM 1 % EX GEL: 4.0000 g | Freq: Four times a day (QID) | CUTANEOUS | Status: DC

## 2023-09-09 NOTE — Progress Notes (Signed)
Pt discharged with PTAR to facility.

## 2023-09-09 NOTE — Care Management Important Message (Signed)
Important Message  Patient Details No IM Letter given due to Hospice. Name: Elaine Campbell MRN: [id number] Date of Birth: 1932-04-22   Important Message Given:  No     Caren Macadam 09/09/2023, 11:22 AM

## 2023-09-09 NOTE — Discharge Summary (Signed)
Physician Discharge Summary  Elaine Campbell:[id number] DOB: 18-Feb-1932 DOA: 09/05/2023  PCP: Eliezer Lofts, MD  Admit date: 09/05/2023 Discharge date: 09/09/2023  Admitted From: Independent living facility Disposition: Skilled nursing facility  Recommendations for Outpatient Follow-up:  Follow-up with [facility] care hospice.  Home Health: N/A Equipment/Devices: N/A  Discharge Condition: Fair CODE STATUS: DNR Diet recommendation: Regular diet, nutritional supplements  Discharge summary: 89 year old from independent living, has history of chronic anemia, CKD stage IIIb, hypertension, brought to the emergency room with complaints of not feeling well, fatigue and tired for at least 3 days.  In the emergency room she was found to have temperature 93.7, she had abnormal urine.  She had decreased oral intake and was very nauseous.  Admitted with hypothermia and UTI.  Also noted to have AKI. Patient had slight clinical improvement today but she overall remains very frail, unable to move.  Her kidney functions also abnormal.  Given overall frailty and extreme debility, patient herself made a decision to go back to parents home and have hospice follow-up.  She does not want to be readmitted.  She will need additional care, at this time she will be transition to a skilled nursing facility with hospice follow-up.   Sepsis present on admission secondary to UTI.  Hypothermia.  AKI. Adequately resuscitated.  Blood cultures negative.  Urine cultures growing E. coli.  Rocephin day 4 today.  She also had urinary retention.  Will treat as complicated UTI, Keflex 500 mg twice daily for additional 6 days.  Patient is agreeable to continue oral antibiotics.   AKI on CKD stage IIIb: Recent baseline creatinine of 1.27.  This is likely prerenal.  She has a Foley catheter in place.   Creatinine remains elevated.  This is likely her new baseline. Encourage oral intake. Patient is very debilitated and unable to  move for urination, discharging with indwelling Foley catheter.  If her mobility improves, this can be removed.   Anemia of chronic disease: Baseline known hemoglobin of 10.  Hemoglobin dropped to 8 and subsequently to 7.1.   She is positive for FOBT.  There is no clinical evidence of ongoing active bleeding.   Declined endoscopy by patient and her healthcare power of attorney.  Continue supportive measures.  B12, iron levels are adequate. 1 unit PRBC transfusion 10/29.  Appropriately responded. She does not want any further intervention.  Goal of care: Patient has clear goal of care with DNR and limited intervention.  She does not want any procedures.   Patient thinks that she may never be able to get up and walk independently.  She thinks she may want to spend end-of-life at friend's home.   Patient willing to go back to parents home and enroll into hospice program and not to be admitted to the hospital.  This was arranged.  She is stable to discharge.  Currently not needing any controlled substances including opiates or benzodiazepines. Stable to transition to a SNF level of care.   Discharge Diagnoses:  Principal Problem:   AKI (acute kidney injury) (HCC) Active Problems:   HTN (hypertension)   CKD (chronic kidney disease) stage 3, GFR 30-59 ml/min (HCC)   Anemia   Sepsis (HCC)   Occult blood in stools   Hypothermia   UTI (urinary tract infection)   Hyponatremia   Hyperkalemia   Metabolic acidosis    Discharge Instructions  Discharge Instructions     Diet general   Complete by: As directed    Discharge instructions  Physician Discharge Summary  Elaine Campbell:[id number] DOB: 18-Feb-1932 DOA: 09/05/2023  PCP: Eliezer Lofts, MD  Admit date: 09/05/2023 Discharge date: 09/09/2023  Admitted From: Independent living facility Disposition: Skilled nursing facility  Recommendations for Outpatient Follow-up:  Follow-up with [facility] care hospice.  Home Health: N/A Equipment/Devices: N/A  Discharge Condition: Fair CODE STATUS: DNR Diet recommendation: Regular diet, nutritional supplements  Discharge summary: 89 year old from independent living, has history of chronic anemia, CKD stage IIIb, hypertension, brought to the emergency room with complaints of not feeling well, fatigue and tired for at least 3 days.  In the emergency room she was found to have temperature 93.7, she had abnormal urine.  She had decreased oral intake and was very nauseous.  Admitted with hypothermia and UTI.  Also noted to have AKI. Patient had slight clinical improvement today but she overall remains very frail, unable to move.  Her kidney functions also abnormal.  Given overall frailty and extreme debility, patient herself made a decision to go back to parents home and have hospice follow-up.  She does not want to be readmitted.  She will need additional care, at this time she will be transition to a skilled nursing facility with hospice follow-up.   Sepsis present on admission secondary to UTI.  Hypothermia.  AKI. Adequately resuscitated.  Blood cultures negative.  Urine cultures growing E. coli.  Rocephin day 4 today.  She also had urinary retention.  Will treat as complicated UTI, Keflex 500 mg twice daily for additional 6 days.  Patient is agreeable to continue oral antibiotics.   AKI on CKD stage IIIb: Recent baseline creatinine of 1.27.  This is likely prerenal.  She has a Foley catheter in place.   Creatinine remains elevated.  This is likely her new baseline. Encourage oral intake. Patient is very debilitated and unable to  move for urination, discharging with indwelling Foley catheter.  If her mobility improves, this can be removed.   Anemia of chronic disease: Baseline known hemoglobin of 10.  Hemoglobin dropped to 8 and subsequently to 7.1.   She is positive for FOBT.  There is no clinical evidence of ongoing active bleeding.   Declined endoscopy by patient and her healthcare power of attorney.  Continue supportive measures.  B12, iron levels are adequate. 1 unit PRBC transfusion 10/29.  Appropriately responded. She does not want any further intervention.  Goal of care: Patient has clear goal of care with DNR and limited intervention.  She does not want any procedures.   Patient thinks that she may never be able to get up and walk independently.  She thinks she may want to spend end-of-life at friend's home.   Patient willing to go back to parents home and enroll into hospice program and not to be admitted to the hospital.  This was arranged.  She is stable to discharge.  Currently not needing any controlled substances including opiates or benzodiazepines. Stable to transition to a SNF level of care.   Discharge Diagnoses:  Principal Problem:   AKI (acute kidney injury) (HCC) Active Problems:   HTN (hypertension)   CKD (chronic kidney disease) stage 3, GFR 30-59 ml/min (HCC)   Anemia   Sepsis (HCC)   Occult blood in stools   Hypothermia   UTI (urinary tract infection)   Hyponatremia   Hyperkalemia   Metabolic acidosis    Discharge Instructions  Discharge Instructions     Diet general   Complete by: As directed    Discharge instructions  Physician Discharge Summary  Elaine Campbell:[id number] DOB: 18-Feb-1932 DOA: 09/05/2023  PCP: Eliezer Lofts, MD  Admit date: 09/05/2023 Discharge date: 09/09/2023  Admitted From: Independent living facility Disposition: Skilled nursing facility  Recommendations for Outpatient Follow-up:  Follow-up with [facility] care hospice.  Home Health: N/A Equipment/Devices: N/A  Discharge Condition: Fair CODE STATUS: DNR Diet recommendation: Regular diet, nutritional supplements  Discharge summary: 89 year old from independent living, has history of chronic anemia, CKD stage IIIb, hypertension, brought to the emergency room with complaints of not feeling well, fatigue and tired for at least 3 days.  In the emergency room she was found to have temperature 93.7, she had abnormal urine.  She had decreased oral intake and was very nauseous.  Admitted with hypothermia and UTI.  Also noted to have AKI. Patient had slight clinical improvement today but she overall remains very frail, unable to move.  Her kidney functions also abnormal.  Given overall frailty and extreme debility, patient herself made a decision to go back to parents home and have hospice follow-up.  She does not want to be readmitted.  She will need additional care, at this time she will be transition to a skilled nursing facility with hospice follow-up.   Sepsis present on admission secondary to UTI.  Hypothermia.  AKI. Adequately resuscitated.  Blood cultures negative.  Urine cultures growing E. coli.  Rocephin day 4 today.  She also had urinary retention.  Will treat as complicated UTI, Keflex 500 mg twice daily for additional 6 days.  Patient is agreeable to continue oral antibiotics.   AKI on CKD stage IIIb: Recent baseline creatinine of 1.27.  This is likely prerenal.  She has a Foley catheter in place.   Creatinine remains elevated.  This is likely her new baseline. Encourage oral intake. Patient is very debilitated and unable to  move for urination, discharging with indwelling Foley catheter.  If her mobility improves, this can be removed.   Anemia of chronic disease: Baseline known hemoglobin of 10.  Hemoglobin dropped to 8 and subsequently to 7.1.   She is positive for FOBT.  There is no clinical evidence of ongoing active bleeding.   Declined endoscopy by patient and her healthcare power of attorney.  Continue supportive measures.  B12, iron levels are adequate. 1 unit PRBC transfusion 10/29.  Appropriately responded. She does not want any further intervention.  Goal of care: Patient has clear goal of care with DNR and limited intervention.  She does not want any procedures.   Patient thinks that she may never be able to get up and walk independently.  She thinks she may want to spend end-of-life at friend's home.   Patient willing to go back to parents home and enroll into hospice program and not to be admitted to the hospital.  This was arranged.  She is stable to discharge.  Currently not needing any controlled substances including opiates or benzodiazepines. Stable to transition to a SNF level of care.   Discharge Diagnoses:  Principal Problem:   AKI (acute kidney injury) (HCC) Active Problems:   HTN (hypertension)   CKD (chronic kidney disease) stage 3, GFR 30-59 ml/min (HCC)   Anemia   Sepsis (HCC)   Occult blood in stools   Hypothermia   UTI (urinary tract infection)   Hyponatremia   Hyperkalemia   Metabolic acidosis    Discharge Instructions  Discharge Instructions     Diet general   Complete by: As directed    Discharge instructions  Complete by: As directed    Discharge with foley catheter   Increase activity slowly   Complete by: As directed       Allergies as of 09/09/2023       Reactions   Amoxicillin Nausea And Vomiting   Ivp Dye [iodinated Contrast Media]         Medication List     STOP taking these medications    losartan 25 MG tablet Commonly known as:  COZAAR   losartan 50 MG tablet Commonly known as: COZAAR   naproxen sodium 220 MG tablet Commonly known as: ALEVE       TAKE these medications    acetaminophen 325 MG tablet Commonly known as: TYLENOL Take 2 tablets (650 mg total) by mouth every 6 (six) hours as needed for mild pain (pain score 1-3) (or Fever >/= 101).   cephALEXin 500 MG capsule Commonly known as: KEFLEX Take 1 capsule (500 mg total) by mouth 2 (two) times daily for 7 days.   diclofenac Sodium 1 % Gel Commonly known as: VOLTAREN Apply 4 g topically 4 (four) times daily.        Allergies  Allergen Reactions   Amoxicillin Nausea And Vomiting   Ivp Dye [Iodinated Contrast Media]     Consultations: Palliative care    Procedures/Studies: ECHOCARDIOGRAM COMPLETE  Result Date: 09/06/2023    ECHOCARDIOGRAM REPORT   Patient Name:   ROYALTY STEFANOWICZ Date of Exam: 09/06/2023 Medical Rec #:  [id number]    Height:       63.0 in Accession #:    [id number]   Weight:       123.9 lb Date of Birth:  Apr 09, 1932     BSA:          1.578 m Patient Age:    89 years     BP:           140/42 mmHg Patient Gender: F            HR:           88 bpm. Exam Location:  Inpatient Procedure: 2D Echo, Color Doppler and Cardiac Doppler Indications:    Murmur  History:        Patient has no prior history of Echocardiogram examinations.                 Signs/Symptoms:Murmur.  Sonographer:    Darlys Gales Referring Phys: [id number] ANASTASSIA DOUTOVA IMPRESSIONS  1. Left ventricular ejection fraction, by estimation, is 60 to 65%. The left ventricle has normal function. The left ventricle has no regional wall motion abnormalities. Left ventricular diastolic parameters were normal.  2. Right ventricular systolic function is normal. The right ventricular size is normal.  3. The mitral valve is normal in structure. No evidence of mitral valve regurgitation. No evidence of mitral stenosis.  4. The aortic valve is normal in structure. Aortic valve regurgitation  is not visualized. No aortic stenosis is present.  5. The inferior vena cava is normal in size with greater than 50% respiratory variability, suggesting right atrial pressure of 3 mmHg. FINDINGS  Left Ventricle: Left ventricular ejection fraction, by estimation, is 60 to 65%. The left ventricle has normal function. The left ventricle has no regional wall motion abnormalities. The left ventricular internal cavity size was normal in size. There is  no left ventricular hypertrophy. Left ventricular diastolic parameters were normal. Right Ventricle: The right ventricular size is normal. No increase in right ventricular wall thickness. Right ventricular  Complete by: As directed    Discharge with foley catheter   Increase activity slowly   Complete by: As directed       Allergies as of 09/09/2023       Reactions   Amoxicillin Nausea And Vomiting   Ivp Dye [iodinated Contrast Media]         Medication List     STOP taking these medications    losartan 25 MG tablet Commonly known as:  COZAAR   losartan 50 MG tablet Commonly known as: COZAAR   naproxen sodium 220 MG tablet Commonly known as: ALEVE       TAKE these medications    acetaminophen 325 MG tablet Commonly known as: TYLENOL Take 2 tablets (650 mg total) by mouth every 6 (six) hours as needed for mild pain (pain score 1-3) (or Fever >/= 101).   cephALEXin 500 MG capsule Commonly known as: KEFLEX Take 1 capsule (500 mg total) by mouth 2 (two) times daily for 7 days.   diclofenac Sodium 1 % Gel Commonly known as: VOLTAREN Apply 4 g topically 4 (four) times daily.        Allergies  Allergen Reactions   Amoxicillin Nausea And Vomiting   Ivp Dye [Iodinated Contrast Media]     Consultations: Palliative care    Procedures/Studies: ECHOCARDIOGRAM COMPLETE  Result Date: 09/06/2023    ECHOCARDIOGRAM REPORT   Patient Name:   ROYALTY STEFANOWICZ Date of Exam: 09/06/2023 Medical Rec #:  [id number]    Height:       63.0 in Accession #:    [id number]   Weight:       123.9 lb Date of Birth:  Apr 09, 1932     BSA:          1.578 m Patient Age:    89 years     BP:           140/42 mmHg Patient Gender: F            HR:           88 bpm. Exam Location:  Inpatient Procedure: 2D Echo, Color Doppler and Cardiac Doppler Indications:    Murmur  History:        Patient has no prior history of Echocardiogram examinations.                 Signs/Symptoms:Murmur.  Sonographer:    Darlys Gales Referring Phys: [id number] ANASTASSIA DOUTOVA IMPRESSIONS  1. Left ventricular ejection fraction, by estimation, is 60 to 65%. The left ventricle has normal function. The left ventricle has no regional wall motion abnormalities. Left ventricular diastolic parameters were normal.  2. Right ventricular systolic function is normal. The right ventricular size is normal.  3. The mitral valve is normal in structure. No evidence of mitral valve regurgitation. No evidence of mitral stenosis.  4. The aortic valve is normal in structure. Aortic valve regurgitation  is not visualized. No aortic stenosis is present.  5. The inferior vena cava is normal in size with greater than 50% respiratory variability, suggesting right atrial pressure of 3 mmHg. FINDINGS  Left Ventricle: Left ventricular ejection fraction, by estimation, is 60 to 65%. The left ventricle has normal function. The left ventricle has no regional wall motion abnormalities. The left ventricular internal cavity size was normal in size. There is  no left ventricular hypertrophy. Left ventricular diastolic parameters were normal. Right Ventricle: The right ventricular size is normal. No increase in right ventricular wall thickness. Right ventricular  Complete by: As directed    Discharge with foley catheter   Increase activity slowly   Complete by: As directed       Allergies as of 09/09/2023       Reactions   Amoxicillin Nausea And Vomiting   Ivp Dye [iodinated Contrast Media]         Medication List     STOP taking these medications    losartan 25 MG tablet Commonly known as:  COZAAR   losartan 50 MG tablet Commonly known as: COZAAR   naproxen sodium 220 MG tablet Commonly known as: ALEVE       TAKE these medications    acetaminophen 325 MG tablet Commonly known as: TYLENOL Take 2 tablets (650 mg total) by mouth every 6 (six) hours as needed for mild pain (pain score 1-3) (or Fever >/= 101).   cephALEXin 500 MG capsule Commonly known as: KEFLEX Take 1 capsule (500 mg total) by mouth 2 (two) times daily for 7 days.   diclofenac Sodium 1 % Gel Commonly known as: VOLTAREN Apply 4 g topically 4 (four) times daily.        Allergies  Allergen Reactions   Amoxicillin Nausea And Vomiting   Ivp Dye [Iodinated Contrast Media]     Consultations: Palliative care    Procedures/Studies: ECHOCARDIOGRAM COMPLETE  Result Date: 09/06/2023    ECHOCARDIOGRAM REPORT   Patient Name:   ROYALTY STEFANOWICZ Date of Exam: 09/06/2023 Medical Rec #:  [id number]    Height:       63.0 in Accession #:    [id number]   Weight:       123.9 lb Date of Birth:  Apr 09, 1932     BSA:          1.578 m Patient Age:    89 years     BP:           140/42 mmHg Patient Gender: F            HR:           88 bpm. Exam Location:  Inpatient Procedure: 2D Echo, Color Doppler and Cardiac Doppler Indications:    Murmur  History:        Patient has no prior history of Echocardiogram examinations.                 Signs/Symptoms:Murmur.  Sonographer:    Darlys Gales Referring Phys: [id number] ANASTASSIA DOUTOVA IMPRESSIONS  1. Left ventricular ejection fraction, by estimation, is 60 to 65%. The left ventricle has normal function. The left ventricle has no regional wall motion abnormalities. Left ventricular diastolic parameters were normal.  2. Right ventricular systolic function is normal. The right ventricular size is normal.  3. The mitral valve is normal in structure. No evidence of mitral valve regurgitation. No evidence of mitral stenosis.  4. The aortic valve is normal in structure. Aortic valve regurgitation  is not visualized. No aortic stenosis is present.  5. The inferior vena cava is normal in size with greater than 50% respiratory variability, suggesting right atrial pressure of 3 mmHg. FINDINGS  Left Ventricle: Left ventricular ejection fraction, by estimation, is 60 to 65%. The left ventricle has normal function. The left ventricle has no regional wall motion abnormalities. The left ventricular internal cavity size was normal in size. There is  no left ventricular hypertrophy. Left ventricular diastolic parameters were normal. Right Ventricle: The right ventricular size is normal. No increase in right ventricular wall thickness. Right ventricular  Complete by: As directed    Discharge with foley catheter   Increase activity slowly   Complete by: As directed       Allergies as of 09/09/2023       Reactions   Amoxicillin Nausea And Vomiting   Ivp Dye [iodinated Contrast Media]         Medication List     STOP taking these medications    losartan 25 MG tablet Commonly known as:  COZAAR   losartan 50 MG tablet Commonly known as: COZAAR   naproxen sodium 220 MG tablet Commonly known as: ALEVE       TAKE these medications    acetaminophen 325 MG tablet Commonly known as: TYLENOL Take 2 tablets (650 mg total) by mouth every 6 (six) hours as needed for mild pain (pain score 1-3) (or Fever >/= 101).   cephALEXin 500 MG capsule Commonly known as: KEFLEX Take 1 capsule (500 mg total) by mouth 2 (two) times daily for 7 days.   diclofenac Sodium 1 % Gel Commonly known as: VOLTAREN Apply 4 g topically 4 (four) times daily.        Allergies  Allergen Reactions   Amoxicillin Nausea And Vomiting   Ivp Dye [Iodinated Contrast Media]     Consultations: Palliative care    Procedures/Studies: ECHOCARDIOGRAM COMPLETE  Result Date: 09/06/2023    ECHOCARDIOGRAM REPORT   Patient Name:   ROYALTY STEFANOWICZ Date of Exam: 09/06/2023 Medical Rec #:  [id number]    Height:       63.0 in Accession #:    [id number]   Weight:       123.9 lb Date of Birth:  Apr 09, 1932     BSA:          1.578 m Patient Age:    89 years     BP:           140/42 mmHg Patient Gender: F            HR:           88 bpm. Exam Location:  Inpatient Procedure: 2D Echo, Color Doppler and Cardiac Doppler Indications:    Murmur  History:        Patient has no prior history of Echocardiogram examinations.                 Signs/Symptoms:Murmur.  Sonographer:    Darlys Gales Referring Phys: [id number] ANASTASSIA DOUTOVA IMPRESSIONS  1. Left ventricular ejection fraction, by estimation, is 60 to 65%. The left ventricle has normal function. The left ventricle has no regional wall motion abnormalities. Left ventricular diastolic parameters were normal.  2. Right ventricular systolic function is normal. The right ventricular size is normal.  3. The mitral valve is normal in structure. No evidence of mitral valve regurgitation. No evidence of mitral stenosis.  4. The aortic valve is normal in structure. Aortic valve regurgitation  is not visualized. No aortic stenosis is present.  5. The inferior vena cava is normal in size with greater than 50% respiratory variability, suggesting right atrial pressure of 3 mmHg. FINDINGS  Left Ventricle: Left ventricular ejection fraction, by estimation, is 60 to 65%. The left ventricle has normal function. The left ventricle has no regional wall motion abnormalities. The left ventricular internal cavity size was normal in size. There is  no left ventricular hypertrophy. Left ventricular diastolic parameters were normal. Right Ventricle: The right ventricular size is normal. No increase in right ventricular wall thickness. Right ventricular  Physician Discharge Summary  Elaine Campbell:[id number] DOB: 18-Feb-1932 DOA: 09/05/2023  PCP: Eliezer Lofts, MD  Admit date: 09/05/2023 Discharge date: 09/09/2023  Admitted From: Independent living facility Disposition: Skilled nursing facility  Recommendations for Outpatient Follow-up:  Follow-up with [facility] care hospice.  Home Health: N/A Equipment/Devices: N/A  Discharge Condition: Fair CODE STATUS: DNR Diet recommendation: Regular diet, nutritional supplements  Discharge summary: 89 year old from independent living, has history of chronic anemia, CKD stage IIIb, hypertension, brought to the emergency room with complaints of not feeling well, fatigue and tired for at least 3 days.  In the emergency room she was found to have temperature 93.7, she had abnormal urine.  She had decreased oral intake and was very nauseous.  Admitted with hypothermia and UTI.  Also noted to have AKI. Patient had slight clinical improvement today but she overall remains very frail, unable to move.  Her kidney functions also abnormal.  Given overall frailty and extreme debility, patient herself made a decision to go back to parents home and have hospice follow-up.  She does not want to be readmitted.  She will need additional care, at this time she will be transition to a skilled nursing facility with hospice follow-up.   Sepsis present on admission secondary to UTI.  Hypothermia.  AKI. Adequately resuscitated.  Blood cultures negative.  Urine cultures growing E. coli.  Rocephin day 4 today.  She also had urinary retention.  Will treat as complicated UTI, Keflex 500 mg twice daily for additional 6 days.  Patient is agreeable to continue oral antibiotics.   AKI on CKD stage IIIb: Recent baseline creatinine of 1.27.  This is likely prerenal.  She has a Foley catheter in place.   Creatinine remains elevated.  This is likely her new baseline. Encourage oral intake. Patient is very debilitated and unable to  move for urination, discharging with indwelling Foley catheter.  If her mobility improves, this can be removed.   Anemia of chronic disease: Baseline known hemoglobin of 10.  Hemoglobin dropped to 8 and subsequently to 7.1.   She is positive for FOBT.  There is no clinical evidence of ongoing active bleeding.   Declined endoscopy by patient and her healthcare power of attorney.  Continue supportive measures.  B12, iron levels are adequate. 1 unit PRBC transfusion 10/29.  Appropriately responded. She does not want any further intervention.  Goal of care: Patient has clear goal of care with DNR and limited intervention.  She does not want any procedures.   Patient thinks that she may never be able to get up and walk independently.  She thinks she may want to spend end-of-life at friend's home.   Patient willing to go back to parents home and enroll into hospice program and not to be admitted to the hospital.  This was arranged.  She is stable to discharge.  Currently not needing any controlled substances including opiates or benzodiazepines. Stable to transition to a SNF level of care.   Discharge Diagnoses:  Principal Problem:   AKI (acute kidney injury) (HCC) Active Problems:   HTN (hypertension)   CKD (chronic kidney disease) stage 3, GFR 30-59 ml/min (HCC)   Anemia   Sepsis (HCC)   Occult blood in stools   Hypothermia   UTI (urinary tract infection)   Hyponatremia   Hyperkalemia   Metabolic acidosis    Discharge Instructions  Discharge Instructions     Diet general   Complete by: As directed    Discharge instructions

## 2023-09-09 NOTE — Plan of Care (Signed)
  Problem: Clinical Measurements: Goal: Ability to maintain clinical measurements within normal limits will improve Outcome: Progressing Goal: Will remain free from infection Outcome: Progressing   Problem: Nutrition: Goal: Adequate nutrition will be maintained Outcome: Progressing   Problem: Pain Management: Goal: General experience of comfort will improve Outcome: Progressing   Problem: Safety: Goal: Ability to remain free from injury will improve Outcome: Progressing   Problem: Clinical Measurements: Goal: Signs and symptoms of infection will decrease Outcome: Progressing

## 2023-09-09 NOTE — [place] FL2 (Signed)
[place] MEDICAID FL2 LEVEL OF CARE FORM     IDENTIFICATION  Patient Name: Elaine Campbell Birthdate: [birth year] Sex: female Admission Date (Current Location): 09/05/2023  [facility] and [place] Number:  Producer, television/film/video and Address:  [facility],  [address] [place], [place] [address]      Provider Number: [id number]  Attending Physician Name and Address:  Dorcas Carrow, MD  Relative Name and Phone Number:  Lenox Ponds [phone]    Current Level of Care: Hospital Recommended Level of Care: Skilled Nursing Facility Prior Approval Number:    Date Approved/Denied:   PASRR Number: [id number]  Discharge Plan: SNF    Current Diagnoses: Patient Active Problem List   Diagnosis Date Noted   AKI (acute kidney injury) (HCC) 09/05/2023   Sepsis (HCC) 09/05/2023   Occult blood in stools 09/05/2023   Hypothermia 09/05/2023   UTI (urinary tract infection) 09/05/2023   Hyponatremia 09/05/2023   Hyperkalemia 09/05/2023   Metabolic acidosis 09/05/2023   Gait abnormality 04/20/2023   Peripheral edema 04/20/2023   HTN (hypertension) 04/09/2023   CKD (chronic kidney disease) stage 3, GFR 30-59 ml/min (HCC) 04/09/2023   Anemia 04/09/2023   Left ankle pain 04/09/2023    Orientation RESPIRATION BLADDER Height & Weight     Self, Time, Situation, Place  Normal Continent Weight: 123 lb 14.4 oz (56.2 kg) Height:  5' 2.99" (160 cm)  BEHAVIORAL SYMPTOMS/MOOD NEUROLOGICAL BOWEL NUTRITION STATUS      Continent Diet (Regular)  AMBULATORY STATUS COMMUNICATION OF NEEDS Skin   Limited Assist Verbally Normal                       Personal Care Assistance Level of Assistance  Bathing, Feeding, Dressing Bathing Assistance: Limited assistance Feeding assistance: Independent Dressing Assistance: Limited assistance     Functional Limitations Info  Sight, Hearing, Speech Sight Info: Adequate Hearing Info: Adequate Speech Info: Adequate    SPECIAL CARE FACTORS  FREQUENCY  PT (By licensed PT), OT (By licensed OT)     PT Frequency: Eval and treat OT Frequency: Eval and treat            Contractures Contractures Info: Not present    Additional Factors Info  Code Status, Allergies Code Status Info: DNR Allergies Info: Amoxicillin Ivp Dye (Iodinated Contrast Media)           Current Medications (09/09/2023):  This is the current hospital active medication list Current Facility-Administered Medications  Medication Dose Route Frequency Provider Last Rate Last Admin   acetaminophen (TYLENOL) tablet 650 mg  650 mg Oral Q6H PRN Therisa Doyne, MD       Or   acetaminophen (TYLENOL) suppository 650 mg  650 mg Rectal Q6H PRN Doutova, Anastassia, MD       cefTRIAXone (ROCEPHIN) 1 g in sodium chloride 0.9 % 100 mL IVPB  1 g Intravenous Once Dorcas Carrow, MD 200 mL/hr at 09/09/23 1051 1 g at 09/09/23 1051   Chlorhexidine Gluconate Cloth 2 % PADS 6 each  6 each Topical Daily Doutova, Anastassia, MD   6 each at 09/08/23 1005   diclofenac Sodium (VOLTAREN) 1 % topical gel 4 g  4 g Topical QID Dorcas Carrow, MD   4 g at 09/08/23 1753   feeding supplement (KATE FARMS STANDARD 1.4) liquid 325 mL  325 mL Oral Daily Dorcas Carrow, MD   325 mL at 09/07/23 1157   folic acid (FOLVITE) tablet 1 mg  1 mg Oral Daily  Therisa Doyne, MD   1 mg at 09/09/23 1610   HYDROcodone-acetaminophen (NORCO/VICODIN) 5-325 MG per tablet 1-2 tablet  1-2 tablet Oral Q4H PRN Therisa Doyne, MD       multivitamin with minerals tablet 1 tablet  1 tablet Oral Daily Therisa Doyne, MD   1 tablet at 09/09/23 0955   ondansetron (ZOFRAN) tablet 4 mg  4 mg Oral Q6H PRN Therisa Doyne, MD       Or   ondansetron (ZOFRAN) injection 4 mg  4 mg Intravenous Q6H PRN Doutova, Anastassia, MD       Oral care mouth rinse  15 mL Mouth Rinse PRN Doutova, Anastassia, MD       pantoprazole (PROTONIX) injection 40 mg  40 mg Intravenous Q12H Doutova, Anastassia, MD   40 mg at  09/09/23 0955   sodium chloride flush (NS) 0.9 % injection 10 mL  10 mL Intravenous Q12H Dorcas Carrow, MD   10 mL at 09/09/23 0957   thiamine (VITAMIN B1) tablet 100 mg  100 mg Oral QHS Danford Bad, RPH   100 mg at 09/08/23 2111     Discharge Medications: Please see discharge summary for a list of discharge medications.  Relevant Imaging Results:  Relevant Lab Results:   Additional Information SSN: [id number]  Hospice with [facility]  Otelia Santee, LCSW

## 2023-09-09 NOTE — TOC Transition Note (Addendum)
Transition of Care [facility]) - CM/SW Discharge Note   Patient Details  Name: Elaine Campbell MRN: [id number] Date of Birth: 1932/01/20  Transition of Care [facility]) CM/SW Contact:  Otelia Santee, LCSW Phone Number: 09/09/2023, 11:12 AM   Clinical Narrative:    Pt to transfer to [facility] for SNF placement. [facility] to follow pt at SNF for hospice services. Pt will be going to room 28. RN to call report to [phone] and request to be transferred to the RN office or RN desk. DC packet with signed DNR placed at RN station. PTAR called at 11:47am for transportation.    ADDENDUM: Per Thea Gist with [facility] pt has opted for outpatient palliative care services at discharge vs hospice.   Final next level of care: Skilled Nursing Facility Barriers to Discharge: Barriers Resolved   Patient Goals and CMS Choice CMS Medicare.gov Compare Post Acute Care list provided to:: Patient Represenative (must comment) (Patient's HCPOA [name]) Choice offered to / list presented to : [facility] POA / Guardian  Discharge Placement PASRR number recieved: 09/07/23 PASRR number recieved: 09/07/23            Patient chooses bed at: [facility] Patient to be transferred to facility by: PT[facility] Name of family member notified: Patient Patient and family notified of of transfer: 09/09/23  Discharge Plan and Services Additional resources added to the After Visit Summary for       Post Acute Care Choice: Skilled Nursing Facility          DME Arranged: N/A DME Agency: NA                  Social Determinants of Health (SDOH) Interventions SDOH Screenings   Food Insecurity: No Food Insecurity (09/05/2023)  Housing: Low Risk  (09/05/2023)  Transportation Needs: No Transportation Needs (09/05/2023)  Utilities: Not At Risk (09/05/2023)  Tobacco Use: Low Risk  (09/05/2023)     Readmission Risk Interventions    09/08/2023    3:16 PM  Readmission Risk Prevention Plan  Post Dischage  Appt Complete  Medication Screening Complete  Transportation Screening Complete

## 2023-09-10 ENCOUNTER — Encounter: Payer: Self-pay | Admitting: Orthopedic Surgery

## 2023-09-10 ENCOUNTER — Non-Acute Institutional Stay (SKILLED_NURSING_FACILITY): Payer: Self-pay | Admitting: Orthopedic Surgery

## 2023-09-10 DIAGNOSIS — R269 Unspecified abnormalities of gait and mobility: Secondary | ICD-10-CM

## 2023-09-10 DIAGNOSIS — N1832 Chronic kidney disease, stage 3b: Secondary | ICD-10-CM

## 2023-09-10 DIAGNOSIS — I10 Essential (primary) hypertension: Secondary | ICD-10-CM

## 2023-09-10 DIAGNOSIS — D649 Anemia, unspecified: Secondary | ICD-10-CM | POA: Diagnosis not present

## 2023-09-10 DIAGNOSIS — N3000 Acute cystitis without hematuria: Secondary | ICD-10-CM | POA: Diagnosis not present

## 2023-09-10 DIAGNOSIS — N179 Acute kidney failure, unspecified: Secondary | ICD-10-CM | POA: Diagnosis not present

## 2023-09-10 DIAGNOSIS — Z978 Presence of other specified devices: Secondary | ICD-10-CM

## 2023-09-10 DIAGNOSIS — R339 Retention of urine, unspecified: Secondary | ICD-10-CM

## 2023-09-10 LAB — CULTURE, BLOOD (ROUTINE X 2)
Culture: NO GROWTH
Culture: NO GROWTH
Special Requests: ADEQUATE
Special Requests: ADEQUATE

## 2023-09-10 NOTE — Progress Notes (Addendum)
Location:  [facility] Room Number: 28/A Place of Service:  SNF (31) Provider:  Octavia Heir, NP   Elaine Lofts, MD  Patient Care Team: Elaine Lofts, MD as PCP - General (General Practice)  Extended Emergency Contact Information Primary Emergency Contact: Campbell,Elaine Mobile Phone: [phone] Relation: Other Secondary Emergency Contact: Campbell,Elaine Mobile Phone: [phone] Relation: Friend  Code Status:  DNR Goals of care: Advanced Directive information    09/05/2023    1:57 PM  Advanced Directives  Does Patient Have a Medical Advance Directive? No  Would patient like information on creating a medical advance directive? No - Patient declined     Chief Complaint  Patient presents with   Hospitalization Follow-up    Hospitalized 10/27-10/31    HPI:  Pt is a 89 y.o. female seen today for f/u s/p hospitalization 10/27-10/31 at [facility] due to UTI/AKI.   She currently resides on the skilled nursing unit at [facility]. PMH: HTN, CKD, anemia, ankle pain and gait abnormality.  Lived in [place] at [facility] prior to hospitalization. She reported increased fatigue and weakness prior to ED visit. Initial vitals noted temperature of 93.7. Abnormal labs: Hgb 9.4, hct 27.3, Na+ 130, glucose 109, BUN/creat 129/2.22, GFR 21, Lipase 69, lactic acid 0.4, FOBT positive, B12 1073. Admitting diagnosis: sepsis secondary to UTI. Urine cultures grew E.coli, she was given rocephin and transitioned to Keflex x 6 days. ARF treated with IVF and oral hydration, BUN/creat 81/1.96 at discharge. Foley was placed due to weakness. She was given 1 unit PRBC 10/29 due to hgb 7.1. Endoscopy was recommended but refused by patient/HPOA. Iron studies unremarkable. She had initially agreed to hospice, but changed her mind. She does not want palliative consult either. Losartan and naproxen discontinued.   Today, she is upset about HPOA arrangements. She feels she signed her life  over and scared her money will be gone. She has been calling lawyer this morning. She denies generalized pain, chest pain or shortness of breath. Scheduled to see PT/OT/ST later today. Tolerated breakfast well. Blood pressure elevated this morning> 168/88.    No past medical history on file. No past surgical history on file.  Allergies  Allergen Reactions   Amoxicillin Nausea And Vomiting   Ivp Dye [Iodinated Contrast Media]     Outpatient Encounter Medications as of 09/10/2023  Medication Sig   acetaminophen (TYLENOL) 325 MG tablet Take 2 tablets (650 mg total) by mouth every 6 (six) hours as needed for mild pain (pain score 1-3) (or Fever >/= 101).   cephALEXin (KEFLEX) 500 MG capsule Take 1 capsule (500 mg total) by mouth 2 (two) times daily for 7 days.   diclofenac Sodium (VOLTAREN) 1 % GEL Apply 4 g topically 4 (four) times daily.   No facility-administered encounter medications on file as of 09/10/2023.    Review of Systems  Constitutional:  Positive for activity change and fatigue.  HENT:  Negative for hearing loss and trouble swallowing.   Eyes:  Negative for visual disturbance.  Respiratory:  Negative for cough, shortness of breath and wheezing.   Cardiovascular:  Negative for chest pain and leg swelling.  Gastrointestinal:  Negative for abdominal distention, abdominal pain and blood in stool.  Genitourinary:  Negative for dysuria and hematuria.  Musculoskeletal:  Positive for gait problem.  Skin:  Negative for wound.  Neurological:  Positive for weakness. Negative for dizziness and headaches.  Psychiatric/Behavioral:  Negative for confusion, dysphoric mood and sleep disturbance. The  patient is not nervous/anxious.      There is no immunization history on file for this patient. Pertinent  Health Maintenance Due  Topic Date Due   INFLUENZA VACCINE  Never done   DEXA SCAN  Completed      04/20/2023   10:04 AM  Fall Risk  Falls in the past year? 1  Was there an  injury with Fall? 0  Fall Risk Category Calculator 2  Patient at Risk for Falls Due to History of fall(s);Impaired balance/gait  Fall risk Follow up Falls evaluation completed   Functional Status Survey:    Vitals:   09/10/23 1147  BP: (!) 168/88  Pulse: (!) 59  Resp: 18  Temp: 97.9 F (36.6 C)  SpO2: 95%  Weight: 125 lb 8 oz (56.9 kg)  Height: 5\' 2"  (1.575 m)   Body mass index is 22.95 kg/m. Physical Exam Vitals reviewed.  HENT:     Head: Normocephalic.     Right Ear: There is no impacted cerumen.     Left Ear: There is no impacted cerumen.     Nose: Nose normal.     Mouth/Throat:     Mouth: Mucous membranes are moist.  Eyes:     General:        Right eye: No discharge.        Left eye: No discharge.  Cardiovascular:     Rate and Rhythm: Normal rate and regular rhythm.     Pulses: Normal pulses.     Heart sounds: Normal heart sounds.  Pulmonary:     Effort: Pulmonary effort is normal. No respiratory distress.     Breath sounds: Normal breath sounds. No wheezing.  Abdominal:     General: Bowel sounds are normal.     Palpations: Abdomen is soft.  Musculoskeletal:     Cervical back: Neck supple.     Right lower leg: No edema.     Left lower leg: No edema.  Skin:    General: Skin is warm.     Capillary Refill: Capillary refill takes less than 2 seconds.  Neurological:     General: No focal deficit present.     Mental Status: She is alert and oriented to person, place, and time.     Motor: Weakness present.     Gait: Gait abnormal.  Psychiatric:     Comments: Appears stressed, answers questions appropriately, follows commands, alert to self/person/place/situation     Labs reviewed: Recent Labs    09/05/23 2047 09/06/23 0259 09/06/23 0934 09/07/23 0326 09/08/23 0604 09/09/23 0522  NA 132* 134*   < > 137 137 138  K 5.3* 5.4*   < > 4.6 4.0 4.4  CL 112* 115*   < > 112* 110 115*  CO2 11* 14*   < > 17* 20* 19*  GLUCOSE 86 102*   < > 144* 106* 102*  BUN  110* 85*   < > 96* 89* 81*  CREATININE 2.02* 2.06*   < > 2.09* 2.14* 1.96*  CALCIUM 8.1* 7.8*   < > 7.6* 7.6* 7.8*  MG 2.1 2.2  --  2.1  --   --   PHOS 4.5 4.4  --  3.1  --   --    < > = values in this interval not displayed.   Recent Labs    09/05/23 1411 09/06/23 0259 09/07/23 0326  AST 23 20 22   ALT 27 24 25   ALKPHOS 52 49 44  BILITOT 0.2* 0.9  0.7  PROT 5.5* 4.2* 4.1*  ALBUMIN 3.0* 2.0* 1.8*   Recent Labs    09/05/23 1411 09/05/23 2047 09/06/23 0259 09/07/23 0326 09/08/23 0604  WBC 9.4   < > 7.2 6.7 4.5  NEUTROABS 8.0*  --   --  5.9 2.6  HGB 9.4*   < > 8.0* 7.1* 8.3*  HCT 27.3*   < > 23.6* 20.6* 24.3*  MCV 92.5   < > 97.9 93.6 94.6  PLT 162   < > 148* 177 182   < > = values in this interval not displayed.   Lab Results  Component Value Date   TSH 3.388 09/05/2023   No results found for: "HGBA1C" No results found for: "CHOL", "HDL", "LDLCALC", "LDLDIRECT", "TRIG", "CHOLHDL"  Significant Diagnostic Results in last 30 days:  ECHOCARDIOGRAM COMPLETE  Result Date: 09/06/2023    ECHOCARDIOGRAM REPORT   Patient Name:   Elaine Campbell Date of Exam: 09/06/2023 Medical Rec #:  [id number]    Height:       63.0 in Accession #:    [id number]   Weight:       123.9 lb Date of Birth:  08-Apr-1932     BSA:          1.578 m Patient Age:    89 years     BP:           140/42 mmHg Patient Gender: F            HR:           88 bpm. Exam Location:  Inpatient Procedure: 2D Echo, Color Doppler and Cardiac Doppler Indications:    Murmur  History:        Patient has no prior history of Echocardiogram examinations.                 Signs/Symptoms:Murmur.  Sonographer:    Darlys Gales Referring Phys: [id number] ANASTASSIA DOUTOVA IMPRESSIONS  1. Left ventricular ejection fraction, by estimation, is 60 to 65%. The left ventricle has normal function. The left ventricle has no regional wall motion abnormalities. Left ventricular diastolic parameters were normal.  2. Right ventricular systolic function is normal.  The right ventricular size is normal.  3. The mitral valve is normal in structure. No evidence of mitral valve regurgitation. No evidence of mitral stenosis.  4. The aortic valve is normal in structure. Aortic valve regurgitation is not visualized. No aortic stenosis is present.  5. The inferior vena cava is normal in size with greater than 50% respiratory variability, suggesting right atrial pressure of 3 mmHg. FINDINGS  Left Ventricle: Left ventricular ejection fraction, by estimation, is 60 to 65%. The left ventricle has normal function. The left ventricle has no regional wall motion abnormalities. The left ventricular internal cavity size was normal in size. There is  no left ventricular hypertrophy. Left ventricular diastolic parameters were normal. Right Ventricle: The right ventricular size is normal. No increase in right ventricular wall thickness. Right ventricular systolic function is normal. Left Atrium: Left atrial size was normal in size. Right Atrium: Right atrial size was normal in size. Pericardium: There is no evidence of pericardial effusion. Mitral Valve: The mitral valve is normal in structure. No evidence of mitral valve regurgitation. No evidence of mitral valve stenosis. Tricuspid Valve: The tricuspid valve is normal in structure. Tricuspid valve regurgitation is not demonstrated. No evidence of tricuspid stenosis. Aortic Valve: The aortic valve is normal in structure. Aortic valve regurgitation is not visualized. No  aortic stenosis is present. Aortic valve mean gradient measures 10.5 mmHg. Aortic valve peak gradient measures 18.9 mmHg. Aortic valve area, by VTI measures 2.09 cm. Pulmonic Valve: The pulmonic valve was normal in structure. Pulmonic valve regurgitation is not visualized. No evidence of pulmonic stenosis. Aorta: The aortic root is normal in size and structure. Venous: The inferior vena cava is normal in size with greater than 50% respiratory variability, suggesting right atrial  pressure of 3 mmHg. IAS/Shunts: No atrial level shunt detected by color flow Doppler.  LEFT VENTRICLE PLAX 2D LVIDd:         4.50 cm   Diastology LVIDs:         2.70 cm   LV e' medial:    4.79 cm/s LV PW:         1.00 cm   LV E/e' medial:  14.4 LV IVS:        0.70 cm   LV e' lateral:   8.49 cm/s LVOT diam:     1.80 cm   LV E/e' lateral: 8.1 LV SV:         76 LV SV Index:   48 LVOT Area:     2.54 cm  RIGHT VENTRICLE RV S prime:     18.80 cm/s TAPSE (M-mode): 2.0 cm LEFT ATRIUM             Index        RIGHT ATRIUM           Index LA Vol (A2C):   33.2 ml 21.04 ml/m  RA Area:     12.50 cm LA Vol (A4C):   32.1 ml 20.35 ml/m  RA Volume:   24.10 ml  15.28 ml/m LA Biplane Vol: 35.4 ml 22.44 ml/m  AORTIC VALVE AV Area (Vmax):    1.83 cm AV Area (Vmean):   1.97 cm AV Area (VTI):     2.09 cm AV Vmax:           217.50 cm/s AV Vmean:          149.500 cm/s AV VTI:            0.363 m AV Peak Grad:      18.9 mmHg AV Mean Grad:      10.5 mmHg LVOT Vmax:         156.00 cm/s LVOT Vmean:        116.000 cm/s LVOT VTI:          0.298 m LVOT/AV VTI ratio: 0.82  AORTA Ao Root diam: 3.30 cm MITRAL VALVE                TRICUSPID VALVE MV Area (PHT): 2.49 cm     TR Peak grad:   24.2 mmHg MV Decel Time: 305 msec     TR Vmax:        246.00 cm/s MV E velocity: 69.00 cm/s MV A velocity: 100.00 cm/s  SHUNTS MV E/A ratio:  0.69         Systemic VTI:  0.30 m                             Systemic Diam: 1.80 cm Aditya Sabharwal Electronically signed by Dorthula Nettles Signature Date/Time: 09/06/2023/1:20:46 PM    Final    DG Chest Port 1 View  Result Date: 09/05/2023 CLINICAL DATA:  Shortness of breath and weakness EXAM: PORTABLE CHEST 1 VIEW COMPARISON:  Chest CT 08/30/2029  FINDINGS: The heart size and mediastinal contours are within normal limits. Both lungs are clear. The visualized skeletal structures are unremarkable. IMPRESSION: No active disease. Electronically Signed   By: Gaylyn Rong M.D.   On: 09/05/2023 14:36     Assessment/Plan 1. AKI (acute kidney injury) (HCC) - hospitalized 10/27-10/31 - initial BUN/creat 129/2.22 - now 81/1.96 09/09/2023 - losartan discontinued - considered starting hospice/palliative but decided against it  - bmp in 1 week  2. Acute cystitis without hematuria - urine culture grew E.coli - given rocephin - now on Keflex x 6 days  3. Chronic kidney disease, stage 3b (HCC) - GFR 24 09/09/2023 - encourage hydration with water - avoid NSAIDS  4. Anemia, unspecified type - hgb 7.1 09/07/2023 - given 1 unit PRBC - FOBT positive> endoscopy refused - iron studies normal - B12 level 10.73 - cbc/diff in 1 week  6. Urinary retention - foley kept due to debility/pending hospice - recommend removing if mobility improves  5. Foley catheter in place - placed in hospital  - increased risk for UTI - cont daily care and monthly exchange  6. Primary hypertension - uncontrolled, goal < 150/90 - losartan discontinued due to AKI - continue bp TID x 1 week - consider starting beta blocker if pressures continue to be elevated  7. Gait abnormality - ongoing - now in SNF - cont PT/OT    Family/ staff Communication: plan discussed with   Labs/tests ordered: cbc/diff & bmp in 1 week

## 2023-09-13 ENCOUNTER — Non-Acute Institutional Stay (SKILLED_NURSING_FACILITY): Payer: Self-pay | Admitting: Orthopedic Surgery

## 2023-09-13 ENCOUNTER — Encounter: Payer: Self-pay | Admitting: Orthopedic Surgery

## 2023-09-13 DIAGNOSIS — I10 Essential (primary) hypertension: Secondary | ICD-10-CM

## 2023-09-13 DIAGNOSIS — R413 Other amnesia: Secondary | ICD-10-CM

## 2023-09-13 DIAGNOSIS — N1832 Chronic kidney disease, stage 3b: Secondary | ICD-10-CM | POA: Diagnosis not present

## 2023-09-13 DIAGNOSIS — Z7189 Other specified counseling: Secondary | ICD-10-CM

## 2023-09-13 DIAGNOSIS — M7989 Other specified soft tissue disorders: Secondary | ICD-10-CM | POA: Diagnosis not present

## 2023-09-13 NOTE — Progress Notes (Signed)
Location:   [facility]  Nursing Home Room Number: 28-A Place of Service:  SNF [id number]) Provider:  Hazle Nordmann, NP  PCP: Mahlon Gammon, MD  Patient Care Team: Mahlon Gammon, MD as PCP - General (Internal Medicine)  Extended Emergency Contact Information Primary Emergency Contact: Knott,[name] Mobile Phone: [phone] Relation: Other Secondary Emergency Contact: Mazeeva,Natalia Mobile Phone: [phone] Relation: Friend  Code Status:  DNR Goals of care: Advanced Directive information    09/13/2023   10:36 AM  Advanced Directives  Does Patient Have a Medical Advance Directive? Yes  Type of Advance Directive Out of facility DNR (pink MOST or yellow form)  Does patient want to make changes to medical advance directive? No - Patient declined     Chief Complaint  Patient presents with   Acute Visit    Elevated blood pressure    HPI:  Pt is a 89 y.o. female seen today for acute visit due to elevated blood pressure.   She currently resides on the skilled nursing unit at [facility]. PMH: HTN, CKD, anemia, ankle pain and gait abnormality.   Recently hospitalized 10/27-10/31 due to UTI/AKI.   See blood pressures below. Losartan discontinued last hospitalization due to AKI. She denies chest pain, sob, headaches or blurred vision. Not on medication.   Right arm weeping serous fluid over the weekend. She reports improved swelling/weeping today. Apparently IV infiltrated in hospital and IVF caused right arm swelling. She denies pain today.   MOST form discussed with patient and HPOA. She does not want future hospitalizations.   She is awaiting bed at [facility].    History reviewed. No pertinent past medical history. History reviewed. No pertinent surgical history.  Allergies  Allergen Reactions   Amoxicillin Nausea And Vomiting   Ivp Dye [Iodinated Contrast Media]     Allergies as of 09/13/2023       Reactions   Amoxicillin Nausea And Vomiting   Ivp Dye  [iodinated Contrast Media]         Medication List        Accurate as of September 13, 2023 10:37 AM. If you have any questions, ask your nurse or doctor.          acetaminophen 325 MG tablet Commonly known as: TYLENOL Take 2 tablets (650 mg total) by mouth every 6 (six) hours as needed for mild pain (pain score 1-3) (or Fever >/= 101).   cephALEXin 500 MG capsule Commonly known as: KEFLEX Take 1 capsule (500 mg total) by mouth 2 (two) times daily for 7 days.   diclofenac Sodium 1 % Gel Commonly known as: VOLTAREN Apply 4 g topically 4 (four) times daily.   Zinc Oxide 10 % Oint Apply 1 Application topically every 2 (two) hours as needed.        Review of Systems   There is no immunization history on file for this patient. Pertinent  Health Maintenance Due  Topic Date Due   INFLUENZA VACCINE  Never done   DEXA SCAN  Completed      04/20/2023   10:04 AM  Fall Risk  Falls in the past year? 1  Was there an injury with Fall? 0  Fall Risk Category Calculator 2  Patient at Risk for Falls Due to History of fall(s);Impaired balance/gait  Fall risk Follow up Falls evaluation completed   Functional Status Survey:    Vitals:   09/13/23 1034  BP: (!) 140/79  Pulse: (!) 56  Resp: 17  Temp:  97.9 F (36.6 C)  SpO2: 93%  Weight: 125 lb 11.2 oz (57 kg)  Height: 5\' 2"  (1.575 m)   Body mass index is 22.99 kg/m. Physical Exam  Labs reviewed: Recent Labs    09/05/23 2047 09/06/23 0259 09/06/23 0934 09/07/23 0326 09/08/23 0604 09/09/23 0522  NA 132* 134*   < > 137 137 138  K 5.3* 5.4*   < > 4.6 4.0 4.4  CL 112* 115*   < > 112* 110 115*  CO2 11* 14*   < > 17* 20* 19*  GLUCOSE 86 102*   < > 144* 106* 102*  BUN 110* 85*   < > 96* 89* 81*  CREATININE 2.02* 2.06*   < > 2.09* 2.14* 1.96*  CALCIUM 8.1* 7.8*   < > 7.6* 7.6* 7.8*  MG 2.1 2.2  --  2.1  --   --   PHOS 4.5 4.4  --  3.1  --   --    < > = values in this interval not displayed.   Recent Labs     09/05/23 1411 09/06/23 0259 09/07/23 0326  AST 23 20 22   ALT 27 24 25   ALKPHOS 52 49 44  BILITOT 0.2* 0.9 0.7  PROT 5.5* 4.2* 4.1*  ALBUMIN 3.0* 2.0* 1.8*   Recent Labs    09/05/23 1411 09/05/23 2047 09/06/23 0259 09/07/23 0326 09/08/23 0604  WBC 9.4   < > 7.2 6.7 4.5  NEUTROABS 8.0*  --   --  5.9 2.6  HGB 9.4*   < > 8.0* 7.1* 8.3*  HCT 27.3*   < > 23.6* 20.6* 24.3*  MCV 92.5   < > 97.9 93.6 94.6  PLT 162   < > 148* 177 182   < > = values in this interval not displayed.   Lab Results  Component Value Date   TSH 3.388 09/05/2023   No results found for: "HGBA1C" No results found for: "CHOL", "HDL", "LDLCALC", "LDLDIRECT", "TRIG", "CHOLHDL"  Significant Diagnostic Results in last 30 days:  ECHOCARDIOGRAM COMPLETE  Result Date: 09/06/2023    ECHOCARDIOGRAM REPORT   Patient Name:   TALISA PETRAK Date of Exam: 09/06/2023 Medical Rec #:  [id number]    Height:       63.0 in Accession #:    [id number]   Weight:       123.9 lb Date of Birth:  November 02, 1932     BSA:          1.578 m Patient Age:    89 years     BP:           140/42 mmHg Patient Gender: F            HR:           88 bpm. Exam Location:  Inpatient Procedure: 2D Echo, Color Doppler and Cardiac Doppler Indications:    Murmur  History:        Patient has no prior history of Echocardiogram examinations.                 Signs/Symptoms:Murmur.  Sonographer:    Darlys Gales Referring Phys: [id number] ANASTASSIA DOUTOVA IMPRESSIONS  1. Left ventricular ejection fraction, by estimation, is 60 to 65%. The left ventricle has normal function. The left ventricle has no regional wall motion abnormalities. Left ventricular diastolic parameters were normal.  2. Right ventricular systolic function is normal. The right ventricular size is normal.  3. The mitral valve is normal in structure.  No evidence of mitral valve regurgitation. No evidence of mitral stenosis.  4. The aortic valve is normal in structure. Aortic valve regurgitation is not visualized.  No aortic stenosis is present.  5. The inferior vena cava is normal in size with greater than 50% respiratory variability, suggesting right atrial pressure of 3 mmHg. FINDINGS  Left Ventricle: Left ventricular ejection fraction, by estimation, is 60 to 65%. The left ventricle has normal function. The left ventricle has no regional wall motion abnormalities. The left ventricular internal cavity size was normal in size. There is  no left ventricular hypertrophy. Left ventricular diastolic parameters were normal. Right Ventricle: The right ventricular size is normal. No increase in right ventricular wall thickness. Right ventricular systolic function is normal. Left Atrium: Left atrial size was normal in size. Right Atrium: Right atrial size was normal in size. Pericardium: There is no evidence of pericardial effusion. Mitral Valve: The mitral valve is normal in structure. No evidence of mitral valve regurgitation. No evidence of mitral valve stenosis. Tricuspid Valve: The tricuspid valve is normal in structure. Tricuspid valve regurgitation is not demonstrated. No evidence of tricuspid stenosis. Aortic Valve: The aortic valve is normal in structure. Aortic valve regurgitation is not visualized. No aortic stenosis is present. Aortic valve mean gradient measures 10.5 mmHg. Aortic valve peak gradient measures 18.9 mmHg. Aortic valve area, by VTI measures 2.09 cm. Pulmonic Valve: The pulmonic valve was normal in structure. Pulmonic valve regurgitation is not visualized. No evidence of pulmonic stenosis. Aorta: The aortic root is normal in size and structure. Venous: The inferior vena cava is normal in size with greater than 50% respiratory variability, suggesting right atrial pressure of 3 mmHg. IAS/Shunts: No atrial level shunt detected by color flow Doppler.  LEFT VENTRICLE PLAX 2D LVIDd:         4.50 cm   Diastology LVIDs:         2.70 cm   LV e' medial:    4.79 cm/s LV PW:         1.00 cm   LV E/e' medial:  14.4 LV  IVS:        0.70 cm   LV e' lateral:   8.49 cm/s LVOT diam:     1.80 cm   LV E/e' lateral: 8.1 LV SV:         76 LV SV Index:   48 LVOT Area:     2.54 cm  RIGHT VENTRICLE RV S prime:     18.80 cm/s TAPSE (M-mode): 2.0 cm LEFT ATRIUM             Index        RIGHT ATRIUM           Index LA Vol (A2C):   33.2 ml 21.04 ml/m  RA Area:     12.50 cm LA Vol (A4C):   32.1 ml 20.35 ml/m  RA Volume:   24.10 ml  15.28 ml/m LA Biplane Vol: 35.4 ml 22.44 ml/m  AORTIC VALVE AV Area (Vmax):    1.83 cm AV Area (Vmean):   1.97 cm AV Area (VTI):     2.09 cm AV Vmax:           217.50 cm/s AV Vmean:          149.500 cm/s AV VTI:            0.363 m AV Peak Grad:      18.9 mmHg AV Mean Grad:  10.5 mmHg LVOT Vmax:         156.00 cm/s LVOT Vmean:        116.000 cm/s LVOT VTI:          0.298 m LVOT/AV VTI ratio: 0.82  AORTA Ao Root diam: 3.30 cm MITRAL VALVE                TRICUSPID VALVE MV Area (PHT): 2.49 cm     TR Peak grad:   24.2 mmHg MV Decel Time: 305 msec     TR Vmax:        246.00 cm/s MV E velocity: 69.00 cm/s MV A velocity: 100.00 cm/s  SHUNTS MV E/A ratio:  0.69         Systemic VTI:  0.30 m                             Systemic Diam: 1.80 cm Aditya Sabharwal Electronically signed by Dorthula Nettles Signature Date/Time: 09/06/2023/1:20:46 PM    Final    DG Chest Port 1 View  Result Date: 09/05/2023 CLINICAL DATA:  Shortness of breath and weakness EXAM: PORTABLE CHEST 1 VIEW COMPARISON:  Chest CT 08/30/2029 FINDINGS: The heart size and mediastinal contours are within normal limits. Both lungs are clear. The visualized skeletal structures are unremarkable. IMPRESSION: No active disease. Electronically Signed   By: Gaylyn Rong M.D.   On: 09/05/2023 14:36    Assessment/Plan There are no diagnoses linked to this encounter.   Family/ staff Communication:   Labs/tests ordered:

## 2023-09-16 LAB — BASIC METABOLIC PANEL
BUN: 54 — AB (ref 4–21)
CO2: 25 — AB (ref 13–22)
Chloride: 107 (ref 99–108)
Creatinine: 1.6 — AB (ref 0.5–1.1)
Glucose: 97
Potassium: 4.2 meq/L (ref 3.5–5.1)
Sodium: 138 (ref 137–147)

## 2023-09-16 LAB — CBC AND DIFFERENTIAL
HCT: 28 — AB (ref 36–46)
Hemoglobin: 9.3 — AB (ref 12.0–16.0)
Neutrophils Absolute: 4817
Platelets: 201 10*3/uL (ref 150–400)
WBC: 6.2

## 2023-09-16 LAB — COMPREHENSIVE METABOLIC PANEL: Calcium: 7.9 — AB (ref 8.7–10.7)

## 2023-09-16 LAB — CBC: RBC: 2.9 — AB (ref 3.87–5.11)

## 2023-09-23 ENCOUNTER — Non-Acute Institutional Stay (SKILLED_NURSING_FACILITY): Payer: Medicare Other | Admitting: Internal Medicine

## 2023-09-23 DIAGNOSIS — I10 Essential (primary) hypertension: Secondary | ICD-10-CM | POA: Diagnosis not present

## 2023-09-23 DIAGNOSIS — Z515 Encounter for palliative care: Secondary | ICD-10-CM

## 2023-09-23 DIAGNOSIS — R200 Anesthesia of skin: Secondary | ICD-10-CM

## 2023-09-23 DIAGNOSIS — N1832 Chronic kidney disease, stage 3b: Secondary | ICD-10-CM

## 2023-09-23 DIAGNOSIS — Z978 Presence of other specified devices: Secondary | ICD-10-CM

## 2023-09-23 DIAGNOSIS — H1131 Conjunctival hemorrhage, right eye: Secondary | ICD-10-CM | POA: Diagnosis not present

## 2023-09-24 ENCOUNTER — Encounter: Payer: Self-pay | Admitting: Orthopedic Surgery

## 2023-09-24 ENCOUNTER — Non-Acute Institutional Stay (INDEPENDENT_AMBULATORY_CARE_PROVIDER_SITE_OTHER): Payer: Self-pay | Admitting: Orthopedic Surgery

## 2023-09-24 ENCOUNTER — Encounter: Payer: Self-pay | Admitting: Internal Medicine

## 2023-09-24 DIAGNOSIS — Z0000 Encounter for general adult medical examination without abnormal findings: Secondary | ICD-10-CM | POA: Diagnosis not present

## 2023-09-24 DIAGNOSIS — Z978 Presence of other specified devices: Secondary | ICD-10-CM | POA: Insufficient documentation

## 2023-09-24 DIAGNOSIS — H1131 Conjunctival hemorrhage, right eye: Secondary | ICD-10-CM | POA: Insufficient documentation

## 2023-09-24 DIAGNOSIS — Z515 Encounter for palliative care: Secondary | ICD-10-CM | POA: Insufficient documentation

## 2023-09-24 NOTE — Progress Notes (Signed)
Subjective:   Elaine Campbell is a 89 y.o. female who presents for Medicare Annual (Subsequent) preventive examination.  Visit Complete: In person  Patient Medicare AWV questionnaire was completed by the patient on 09/24/2023; I have confirmed that all information answered by patient is correct and no changes since this date.  Cardiac Risk Factors include: hypertension;sedentary lifestyle;advanced age (>72men, >19 women)     Objective:    Today's Vitals   09/24/23 1041 09/24/23 1133  BP: (!) 144/79   Pulse: (!) 52   Resp: 18   Temp: (!) 97.2 F (36.2 C)   SpO2: 97%   Weight: 122 lb 10.1 oz (55.6 kg)   Height: 5\' 2"  (1.575 m)   PainSc:  0-No pain   Body mass index is 22.43 kg/m.     09/24/2023   10:45 AM 09/13/2023   10:36 AM 09/05/2023    1:57 PM 04/20/2023   10:04 AM 04/09/2023   10:45 AM 04/02/2023   12:09 PM  Advanced Directives  Does Patient Have a Medical Advance Directive? Yes Yes No No No Yes  Type of Advance Directive Out of facility DNR (pink MOST or yellow form) Out of facility DNR (pink MOST or yellow form)  Healthcare Power of [employer] of [place];Living will  Does patient want to make changes to medical advance directive? No - Patient declined No - Patient declined      Would patient like information on creating a medical advance directive?   No - Patient declined No - Patient declined      Current Medications (verified) Outpatient Encounter Medications as of 09/24/2023  Medication Sig   acetaminophen (TYLENOL) 325 MG tablet Take 2 tablets (650 mg total) by mouth every 6 (six) hours as needed for mild pain (pain score 1-3) (or Fever >/= 101).   diclofenac Sodium (VOLTAREN) 1 % GEL Apply 2 g topically every 8 (eight) hours as needed.   Docusate Sodium (COLACE PO) Take 1 tablet by mouth daily as needed.   hydrALAZINE (APRESOLINE) 10 MG tablet Take 10 mg by mouth daily as needed.   Zinc Oxide 10 % OINT Apply 1 Application topically every 2  (two) hours as needed.   [DISCONTINUED] diclofenac Sodium (VOLTAREN) 1 % GEL Apply 4 g topically 4 (four) times daily.   No facility-administered encounter medications on file as of 09/24/2023.    Allergies (verified) Amoxicillin and Ivp dye [iodinated contrast media]   History: History reviewed. No pertinent past medical history. History reviewed. No pertinent surgical history. History reviewed. No pertinent family history. Social History   Socioeconomic History   Marital status: Widowed    Spouse name: Not on file   Number of children: Not on file   Years of education: Not on file   Highest education level: Not on file  Occupational History   Not on file  Tobacco Use   Smoking status: Never   Smokeless tobacco: Never  Substance and Sexual Activity   Alcohol use: Not Currently   Drug use: Never   Sexual activity: Not Currently  Other Topics Concern   Not on file  Social History Narrative   Not on file   Social Determinants of Health   Financial Resource Strain: Low Risk  (09/24/2023)   Overall Financial Resource Strain (CARDIA)    Difficulty of Paying Living Expenses: Not hard at all  Food Insecurity: No Food Insecurity (09/24/2023)   Hunger Vital Sign    Worried About Running Out of Food  in the Last Year: Never true    Ran Out of Food in the Last Year: Never true  Transportation Needs: No Transportation Needs (09/24/2023)   PRAPARE - Administrator, Civil Service (Medical): No    Lack of Transportation (Non-Medical): No  Physical Activity: Sufficiently Active (09/24/2023)   Exercise Vital Sign    Days of Exercise per Week: 5 days    Minutes of Exercise per Session: 30 min  Stress: No Stress Concern Present (09/24/2023)   [employer] of Occupational Health - Occupational Stress Questionnaire    Feeling of Stress : Not at all  Social Connections: Moderately Isolated (09/24/2023)   Social Connection and Isolation Panel [NHANES]    Frequency of  Communication with Friends and Family: More than three times a week    Frequency of Social Gatherings with Friends and Family: Three times a week    Attends Religious Services: More than 4 times per year    Active Member of Clubs or Organizations: No    Attends Banker Meetings: Never    Marital Status: Widowed    Tobacco Counseling Counseling given: Not Answered   Clinical Intake:  Pre-visit preparation completed: No  Pain : No/denies pain Pain Score: 0-No pain     BMI - recorded: 22.43 Nutritional Status: BMI of 19-24  Normal Nutritional Risks: None Diabetes: No  How often do you need to have someone help you when you read instructions, pamphlets, or other written materials from your doctor or pharmacy?: 1 - Never What is the last grade level you completed in school?: College  Interpreter Needed?: No      Activities of Daily Living    09/24/2023   12:10 PM 09/05/2023    8:00 PM  In your present state of health, do you have any difficulty performing the following activities:  Hearing? 0 0  Vision? 0 0  Difficulty concentrating or making decisions? 0 0  Walking or climbing stairs? 1   Dressing or bathing? 1   Doing errands, shopping? 1 1  Preparing Food and eating ? Y   Using the Toilet? Y   In the past six months, have you accidently leaked urine? Y   Do you have problems with loss of bowel control? N   Managing your Medications? Y   Managing your Finances? Y   Comment does with HPOA   Housekeeping or managing your Housekeeping? Y     No care team member to display  Indicate any recent Medical Services you may have received from other than [facility] providers in the past year (date may be approximate).     Assessment:   This is a routine wellness examination for Elaine Campbell.  Hearing/Vision screen No results found.   Goals Addressed             This Visit's Progress    Maintain Mobility and Function   On track    Evidence-based guidance:   Acknowledge and validate impact of pain, loss of strength and potential disfigurement (hand osteoarthritis) on mental health and daily life, such as social isolation, anxiety, depression, impaired sexual relationship and   injury from falls.  Anticipate referral to physical or occupational therapy for assessment, therapeutic exercise and recommendation for adaptive equipment or assistive devices; encourage participation.  Assess impact on ability to perform activities of daily living, as well as engage in sports and leisure events or requirements of work or school.  Provide anticipatory guidance and reassurance about the benefit  of exercise to maintain function; acknowledge and normalize fear that exercise may worsen symptoms.  Encourage regular exercise, at least 10 minutes at a time for 45 minutes per week; consider yoga, water exercise and proprioceptive exercises; encourage use of wearable activity tracker to increase motivation and adherence.  Encourage maintenance or resumption of daily activities, including employment, as pain allows and with minimal exposure to trauma.  Assist patient to advocate for adaptations to the work environment.  Consider level of pain and function, gender, age, lifestyle, patient preference, quality of life, readiness and ?ocapacity to benefit? when recommending patients for orthopaedic surgery consultation.  Explore strategies, such as changes to medication regimen or activity that enables patient to anticipate and manage flare-ups that increase deconditioning and disability.  Explore patient preferences; encourage exposure to a broader range of activities that have been avoided for fear of experiencing pain.  Identify barriers to participation in therapy or exercise, such as pain with activity, anticipated or imagined pain.  Monitor postoperative joint replacement or any preexisting joint replacement for ongoing pain and loss of function; provide social support  and encouragement throughout recovery.   Notes:        Depression Screen    09/24/2023   11:43 AM  PHQ 2/9 Scores  PHQ - 2 Score 0    Fall Risk    09/24/2023   12:12 PM 04/20/2023   10:04 AM  Fall Risk   Falls in the past year? 1 1  Number falls in past yr: 0 1  Injury with Fall? 0 0  Risk for fall due to : History of fall(s);Impaired balance/gait;Impaired mobility History of fall(s);Impaired balance/gait  Follow up Falls evaluation completed;Education provided Falls evaluation completed    MEDICARE RISK AT HOME: Medicare Risk at Home Any stairs in or around the home?: No If so, are there any without handrails?: No Home free of loose throw rugs in walkways, pet beds, electrical cords, etc?: Yes Adequate lighting in your home to reduce risk of falls?: Yes Life alert?: No Use of a cane, walker or w/c?: Yes Grab bars in the bathroom?: Yes Shower chair or bench in shower?: Yes Elevated toilet seat or a handicapped toilet?: Yes  TIMED UP AND GO:  Was the test performed?  No    Cognitive Function:        Immunizations  There is no immunization history on file for this patient.  TDAP status: Due, Education has been provided regarding the importance of this vaccine. Advised may receive this vaccine at local pharmacy or Health Dept. Aware to provide a copy of the vaccination record if obtained from local pharmacy or Health Dept. Verbalized acceptance and understanding.  Flu Vaccine status: Declined, Education has been provided regarding the importance of this vaccine but patient still declined. Advised may receive this vaccine at local pharmacy or Health Dept. Aware to provide a copy of the vaccination record if obtained from local pharmacy or Health Dept. Verbalized acceptance and understanding.  Pneumococcal vaccine status: Due, Education has been provided regarding the importance of this vaccine. Advised may receive this vaccine at local pharmacy or Health Dept. Aware  to provide a copy of the vaccination record if obtained from local pharmacy or Health Dept. Verbalized acceptance and understanding.  Covid-19 vaccine status: Declined, Education has been provided regarding the importance of this vaccine but patient still declined. Advised may receive this vaccine at local pharmacy or Health Dept.or vaccine clinic. Aware to provide a copy of the vaccination record if  obtained from local pharmacy or Health Dept. Verbalized acceptance and understanding.  Qualifies for Shingles Vaccine? No   Zostavax completed No   Shingrix Completed?: No.    Education has been provided regarding the importance of this vaccine. Patient has been advised to call insurance company to determine out of pocket expense if they have not yet received this vaccine. Advised may also receive vaccine at local pharmacy or Health Dept. Verbalized acceptance and understanding.  Screening Tests Health Maintenance  Topic Date Due   DTaP/Tdap/Td (1 - Tdap) Never done   Zoster Vaccines- Shingrix (1 of 2) Never done   Pneumonia Vaccine 41+ Years old (1 of 1 - PCV) Never done   INFLUENZA VACCINE  Never done   COVID-19 Vaccine (1 - 2023-24 season) Never done   DEXA SCAN  Completed   HPV VACCINES  Aged Out    Health Maintenance  Health Maintenance Due  Topic Date Due   DTaP/Tdap/Td (1 - Tdap) Never done   Zoster Vaccines- Shingrix (1 of 2) Never done   Pneumonia Vaccine 41+ Years old (1 of 1 - PCV) Never done   INFLUENZA VACCINE  Never done   COVID-19 Vaccine (1 - 2023-24 season) Never done    Colorectal cancer screening: No longer required.   Mammogram status: No longer required due to advanced age.  Bone Density status: Completed 2001. Results reflect: Bone density results: NORMAL. Repeat every 2 years.  Lung Cancer Screening: (Low Dose CT Chest recommended if Age 38-80 years, 20 pack-year currently smoking OR have quit w/in 15years.) does not qualify.   Lung Cancer Screening Referral:  No  Additional Screening:  Hepatitis C Screening: does not qualify; Completed   Vision Screening: Recommended annual ophthalmology exams for early detection of glaucoma and other disorders of the eye. Is the patient up to date with their annual eye exam?  Yes  Who is the provider or what is the name of the office in which the patient attends annual eye exams? [facility] If pt is not established with a provider, would they like to be referred to a provider to establish care? No .   Dental Screening: Recommended annual dental exams for proper oral hygiene  Diabetic Foot Exam: Diabetic Foot Exam: Completed 09/24/2023  Community Resource Referral / Chronic Care Management: CRR required this visit?  No   CCM required this visit?  No     Plan:     I have personally reviewed and noted the following in the patient's chart:   Medical and social history Use of alcohol, tobacco or illicit drugs  Current medications and supplements including opioid prescriptions. Patient is not currently taking opioid prescriptions. Functional ability and status Nutritional status Physical activity Advanced directives List of other physicians Hospitalizations, surgeries, and ER visits in previous 12 months Vitals Screenings to include cognitive, depression, and falls Referrals and appointments  In addition, I have reviewed and discussed with patient certain preventive protocols, quality metrics, and best practice recommendations. A written personalized care plan for preventive services as well as general preventive health recommendations were provided to patient.     Octavia Heir, NP   09/24/2023   After Visit Summary: (MyChart) Due to this being a telephonic visit, the after visit summary with patients personalized plan was offered to patient via MyChart   Nurse Notes: Recent MMSE 26/30 09/13/2023, lives in SNF at this time, discussed vaccinations, will look into Prevnar 20, unknown  when last bone density was> will try to  find last study

## 2023-09-24 NOTE — Progress Notes (Signed)
Provider:   Location:  [facility] curator of Service:  SNF ([id number])  PCP: No primary care provider on file. No care team member to display  Extended Emergency Contact Information Primary Emergency Contact: Knott,[name] Mobile Phone: [phone] Relation: Other Secondary Emergency Contact: Mazeeva,Natalia Mobile Phone: [phone] Relation: Friend  Code Status: DNR Goals of Care: Advanced Directive information    09/13/2023   10:36 AM  Advanced Directives  Does Patient Have a Medical Advance Directive? Yes  Type of Advance Directive Out of facility DNR (pink MOST or yellow form)  Does patient want to make changes to medical advance directive? No - Patient declined      Chief Complaint  Patient presents with   Acute Visit   New Admit To SNF    HPI: Patient is a 89 y.o. female seen today for admission to SNF [place]  Patient was admitted in the hospital from 10/27 to 10/31 with sepsis due to UTI and acute renal failure Patient has a history of chronic kidney disease, hypertension and chronic anemia. She went to the hospital complaining of fatigue and decreased oral intake.  Was found to have Hypothermia and UTI  also noted to have AKI Her blood cultures were negative  the urine culture was positive for E. coli.  She got 4 days of Rocephin and was discharged on Keflex. During the hospitalization patient also developed urinary retention and now has Foley catheter She also had AKI with creatinine peaking at 2 and BUN of 96  Anemia of chronic disease.  Her hemoglobin did drop to 7.  She was positive for FOBT but declined further workup Patient was referred initially for hospice care but she decided on palliative.  She does not want to go back to the hospital .  Patient's primary care is in [facility].  And she has another physician in [place] that she follows through video visit Today patient was seen in her room She is doing better with therapy.   Still needs assist for her transfers. She also hit her eye while changing her clothes and now has subconjunctival hemorrhage.  But no vision loss or pain She did have complaint of right hand numbness which is chronic for her and she came days get her carpal tunnel was ruled out and they think it is coming from her neck.  No past medical history on file. No past surgical history on file.  reports that she has never smoked. She has never used smokeless tobacco. She reports that she does not currently use alcohol. She reports that she does not use drugs. Social History   Socioeconomic History   Marital status: Widowed    Spouse name: Not on file   Number of children: Not on file   Years of education: Not on file   Highest education level: Not on file  Occupational History   Not on file  Tobacco Use   Smoking status: Never   Smokeless tobacco: Never  Substance and Sexual Activity   Alcohol use: Not Currently   Drug use: Never   Sexual activity: Not Currently  Other Topics Concern   Not on file  Social History Narrative   Not on file   Social Determinants of Health   Financial Resource Strain: Not on file  Food Insecurity: No Food Insecurity (09/05/2023)   Hunger Vital Sign    Worried About Running Out of Food in the Last Year: Never true    Ran Out  of Food in the Last Year: Never true  Transportation Needs: No Transportation Needs (09/05/2023)   PRAPARE - Administrator, Civil Service (Medical): No    Lack of Transportation (Non-Medical): No  Physical Activity: Not on file  Stress: Not on file  Social Connections: Not on file  Intimate Partner Violence: Not At Risk (09/06/2023)   Humiliation, Afraid, Rape, and Kick questionnaire    Fear of Current or Ex-Partner: No    Emotionally Abused: No    Physically Abused: No    Sexually Abused: No    Functional Status Survey:    No family history on file.  Health Maintenance  Topic Date Due   Medicare Annual  Wellness (AWV)  Never done   DTaP/Tdap/Td (1 - Tdap) Never done   Zoster Vaccines- Shingrix (1 of 2) Never done   Pneumonia Vaccine 52+ Years old (1 of 1 - PCV) Never done   INFLUENZA VACCINE  Never done   COVID-19 Vaccine (1 - 2023-24 season) Never done   DEXA SCAN  Completed   HPV VACCINES  Aged Out    Allergies  Allergen Reactions   Amoxicillin Nausea And Vomiting   Ivp Dye [Iodinated Contrast Media]     Outpatient Encounter Medications as of 09/23/2023  Medication Sig   acetaminophen (TYLENOL) 325 MG tablet Take 2 tablets (650 mg total) by mouth every 6 (six) hours as needed for mild pain (pain score 1-3) (or Fever >/= 101).   diclofenac Sodium (VOLTAREN) 1 % GEL Apply 4 g topically 4 (four) times daily.   Zinc Oxide 10 % OINT Apply 1 Application topically every 2 (two) hours as needed.   No facility-administered encounter medications on file as of 09/23/2023.    Review of Systems  Constitutional:  Positive for activity change. Negative for appetite change.  HENT: Negative.    Eyes:  Positive for redness.  Respiratory:  Negative for cough and shortness of breath.   Cardiovascular:  Negative for leg swelling.  Gastrointestinal:  Negative for constipation.  Genitourinary:  Positive for difficulty urinating.  Musculoskeletal:  Positive for gait problem and neck pain. Negative for arthralgias and myalgias.  Skin: Negative.   Neurological:  Positive for weakness. Negative for dizziness.  Psychiatric/Behavioral:  Negative for confusion, dysphoric mood and sleep disturbance.     Vitals:   09/23/23 0834  BP: (!) 144/79  Pulse: (!) 52  Resp: 18  Temp: (!) 97.2 F (36.2 C)  Weight: 122 lb 9.6 oz (55.6 kg)   Body mass index is 22.42 kg/m. Physical Exam Vitals reviewed.  Constitutional:      Appearance: Normal appearance.  HENT:     Head: Normocephalic.     Nose: Nose normal.     Mouth/Throat:     Mouth: Mucous membranes are moist.     Pharynx: Oropharynx is clear.   Eyes:     Pupils: Pupils are equal, round, and reactive to light.     Comments: Right Eye has Subconjunctival Hemorrhage No pain or Vision loss  Cardiovascular:     Rate and Rhythm: Normal rate and regular rhythm.     Pulses: Normal pulses.     Heart sounds: Normal heart sounds. No murmur heard. Pulmonary:     Effort: Pulmonary effort is normal.     Breath sounds: Normal breath sounds.  Abdominal:     General: Abdomen is flat. Bowel sounds are normal.     Palpations: Abdomen is soft.  Musculoskeletal:  General: No swelling.     Cervical back: Neck supple.  Skin:    General: Skin is warm.  Neurological:     General: No focal deficit present.     Mental Status: She is alert and oriented to person, place, and time.  Psychiatric:        Mood and Affect: Mood normal.        Thought Content: Thought content normal.     Labs reviewed: Basic Metabolic Panel: Recent Labs    09/05/23 2047 09/06/23 0259 09/06/23 0934 09/07/23 0326 09/08/23 0604 09/09/23 0522  NA 132* 134*   < > 137 137 138  K 5.3* 5.4*   < > 4.6 4.0 4.4  CL 112* 115*   < > 112* 110 115*  CO2 11* 14*   < > 17* 20* 19*  GLUCOSE 86 102*   < > 144* 106* 102*  BUN 110* 85*   < > 96* 89* 81*  CREATININE 2.02* 2.06*   < > 2.09* 2.14* 1.96*  CALCIUM 8.1* 7.8*   < > 7.6* 7.6* 7.8*  MG 2.1 2.2  --  2.1  --   --   PHOS 4.5 4.4  --  3.1  --   --    < > = values in this interval not displayed.   Liver Function Tests: Recent Labs    09/05/23 1411 09/06/23 0259 09/07/23 0326  AST 23 20 22   ALT 27 24 25   ALKPHOS 52 49 44  BILITOT 0.2* 0.9 0.7  PROT 5.5* 4.2* 4.1*  ALBUMIN 3.0* 2.0* 1.8*   Recent Labs    09/05/23 1411  LIPASE 69*   Recent Labs    09/05/23 2047  AMMONIA 20   CBC: Recent Labs    09/05/23 1411 09/05/23 2047 09/06/23 0259 09/07/23 0326 09/08/23 0604  WBC 9.4   < > 7.2 6.7 4.5  NEUTROABS 8.0*  --   --  5.9 2.6  HGB 9.4*   < > 8.0* 7.1* 8.3*  HCT 27.3*   < > 23.6* 20.6* 24.3*   MCV 92.5   < > 97.9 93.6 94.6  PLT 162   < > 148* 177 182   < > = values in this interval not displayed.   Cardiac Enzymes: Recent Labs    09/05/23 2047  CKTOTAL 102   BNP: Invalid input(s): "POCBNP" No results found for: "HGBA1C" Lab Results  Component Value Date   TSH 3.388 09/05/2023   Lab Results  Component Value Date   VITAMINB12 1,073 (H) 09/05/2023   Lab Results  Component Value Date   FOLATE 15.8 09/05/2023   Lab Results  Component Value Date   IRON 55 09/05/2023   TIBC 259 09/05/2023   FERRITIN 237 09/05/2023    Imaging and Procedures obtained prior to SNF admission: ECHOCARDIOGRAM COMPLETE  Result Date: 09/06/2023    ECHOCARDIOGRAM REPORT   Patient Name:   Elaine Campbell Date of Exam: 09/06/2023 Medical Rec #:  [id number]    Height:       63.0 in Accession #:    [id number]   Weight:       123.9 lb Date of Birth:  [birth year]     BSA:          1.578 m Patient Age:    89 years     BP:           140/42 mmHg Patient Gender: F  HR:           88 bpm. Exam Location:  Inpatient Procedure: 2D Echo, Color Doppler and Cardiac Doppler Indications:    Murmur  History:        Patient has no prior history of Echocardiogram examinations.                 Signs/Symptoms:Murmur.  Sonographer:    Darlys Gales Referring Phys: [id number] ANASTASSIA DOUTOVA IMPRESSIONS  1. Left ventricular ejection fraction, by estimation, is 60 to 65%. The left ventricle has normal function. The left ventricle has no regional wall motion abnormalities. Left ventricular diastolic parameters were normal.  2. Right ventricular systolic function is normal. The right ventricular size is normal.  3. The mitral valve is normal in structure. No evidence of mitral valve regurgitation. No evidence of mitral stenosis.  4. The aortic valve is normal in structure. Aortic valve regurgitation is not visualized. No aortic stenosis is present.  5. The inferior vena cava is normal in size with greater than 50% respiratory  variability, suggesting right atrial pressure of 3 mmHg. FINDINGS  Left Ventricle: Left ventricular ejection fraction, by estimation, is 60 to 65%. The left ventricle has normal function. The left ventricle has no regional wall motion abnormalities. The left ventricular internal cavity size was normal in size. There is  no left ventricular hypertrophy. Left ventricular diastolic parameters were normal. Right Ventricle: The right ventricular size is normal. No increase in right ventricular wall thickness. Right ventricular systolic function is normal. Left Atrium: Left atrial size was normal in size. Right Atrium: Right atrial size was normal in size. Pericardium: There is no evidence of pericardial effusion. Mitral Valve: The mitral valve is normal in structure. No evidence of mitral valve regurgitation. No evidence of mitral valve stenosis. Tricuspid Valve: The tricuspid valve is normal in structure. Tricuspid valve regurgitation is not demonstrated. No evidence of tricuspid stenosis. Aortic Valve: The aortic valve is normal in structure. Aortic valve regurgitation is not visualized. No aortic stenosis is present. Aortic valve mean gradient measures 10.5 mmHg. Aortic valve peak gradient measures 18.9 mmHg. Aortic valve area, by VTI measures 2.09 cm. Pulmonic Valve: The pulmonic valve was normal in structure. Pulmonic valve regurgitation is not visualized. No evidence of pulmonic stenosis. Aorta: The aortic root is normal in size and structure. Venous: The inferior vena cava is normal in size with greater than 50% respiratory variability, suggesting right atrial pressure of 3 mmHg. IAS/Shunts: No atrial level shunt detected by color flow Doppler.  LEFT VENTRICLE PLAX 2D LVIDd:         4.50 cm   Diastology LVIDs:         2.70 cm   LV e' medial:    4.79 cm/s LV PW:         1.00 cm   LV E/e' medial:  14.4 LV IVS:        0.70 cm   LV e' lateral:   8.49 cm/s LVOT diam:     1.80 cm   LV E/e' lateral: 8.1 LV SV:          76 LV SV Index:   48 LVOT Area:     2.54 cm  RIGHT VENTRICLE RV S prime:     18.80 cm/s TAPSE (M-mode): 2.0 cm LEFT ATRIUM             Index        RIGHT ATRIUM  Index LA Vol (A2C):   33.2 ml 21.04 ml/m  RA Area:     12.50 cm LA Vol (A4C):   32.1 ml 20.35 ml/m  RA Volume:   24.10 ml  15.28 ml/m LA Biplane Vol: 35.4 ml 22.44 ml/m  AORTIC VALVE AV Area (Vmax):    1.83 cm AV Area (Vmean):   1.97 cm AV Area (VTI):     2.09 cm AV Vmax:           217.50 cm/s AV Vmean:          149.500 cm/s AV VTI:            0.363 m AV Peak Grad:      18.9 mmHg AV Mean Grad:      10.5 mmHg LVOT Vmax:         156.00 cm/s LVOT Vmean:        116.000 cm/s LVOT VTI:          0.298 m LVOT/AV VTI ratio: 0.82  AORTA Ao Root diam: 3.30 cm MITRAL VALVE                TRICUSPID VALVE MV Area (PHT): 2.49 cm     TR Peak grad:   24.2 mmHg MV Decel Time: 305 msec     TR Vmax:        246.00 cm/s MV E velocity: 69.00 cm/s MV A velocity: 100.00 cm/s  SHUNTS MV E/A ratio:  0.69         Systemic VTI:  0.30 m                             Systemic Diam: 1.80 cm Aditya Sabharwal Electronically signed by Dorthula Nettles Signature Date/Time: 09/06/2023/1:20:46 PM    Final    DG Chest Port 1 View  Result Date: 09/05/2023 CLINICAL DATA:  Shortness of breath and weakness EXAM: PORTABLE CHEST 1 VIEW COMPARISON:  Chest CT 08/30/2029 FINDINGS: The heart size and mediastinal contours are within normal limits. Both lungs are clear. The visualized skeletal structures are unremarkable. IMPRESSION: No active disease. Electronically Signed   By: Gaylyn Rong M.D.   On: 09/05/2023 14:36    Assessment/Plan 1. Subconjunctival hemorrhage of right eye She refused Artifical Tears Continue to monitor  2. Chronic indwelling Foley catheter Patient reluctant for Bladder trial She still needs Assistance with her ADLS and transfers Will try again next week for Bladder Trial  3. Chronic kidney disease, stage 3b (HCC) Her last BUN was 54 and  Creat improved to 1.6  4. Primary hypertension Wrote order for PRN Hydralazine if SBP more then 180 to avoid ED visit Patient refusing all Meds for now 5 Anemia Hgb 9.3 Was FOBT positive in the hospital but Refused work up 6 Weakness Doing better with Therapy Still Needs assist with ADLS   Family/ staff Communication:   Labs/tests ordered:

## 2023-09-24 NOTE — Patient Instructions (Signed)
  [name] , Thank you for taking time to come for your Medicare Wellness Visit. I appreciate your ongoing commitment to your health goals. Please review the following plan we discussed and let me know if I can assist you in the future.   These are the goals we discussed:  Goals      Maintain Mobility and Function     Evidence-based guidance:  Acknowledge and validate impact of pain, loss of strength and potential disfigurement (hand osteoarthritis) on mental health and daily life, such as social isolation, anxiety, depression, impaired sexual relationship and   injury from falls.  Anticipate referral to physical or occupational therapy for assessment, therapeutic exercise and recommendation for adaptive equipment or assistive devices; encourage participation.  Assess impact on ability to perform activities of daily living, as well as engage in sports and leisure events or requirements of work or school.  Provide anticipatory guidance and reassurance about the benefit of exercise to maintain function; acknowledge and normalize fear that exercise may worsen symptoms.  Encourage regular exercise, at least 10 minutes at a time for 45 minutes per week; consider yoga, water exercise and proprioceptive exercises; encourage use of wearable activity tracker to increase motivation and adherence.  Encourage maintenance or resumption of daily activities, including employment, as pain allows and with minimal exposure to trauma.  Assist patient to advocate for adaptations to the work environment.  Consider level of pain and function, gender, age, lifestyle, patient preference, quality of life, readiness and ?ocapacity to benefit? when recommending patients for orthopaedic surgery consultation.  Explore strategies, such as changes to medication regimen or activity that enables patient to anticipate and manage flare-ups that increase deconditioning and disability.  Explore patient preferences; encourage  exposure to a broader range of activities that have been avoided for fear of experiencing pain.  Identify barriers to participation in therapy or exercise, such as pain with activity, anticipated or imagined pain.  Monitor postoperative joint replacement or any preexisting joint replacement for ongoing pain and loss of function; provide social support and encouragement throughout recovery.   Notes:         This is a list of the screening recommended for you and due dates:  Health Maintenance  Topic Date Due   DTaP/Tdap/Td vaccine (1 - Tdap) Never done   Zoster (Shingles) Vaccine (1 of 2) Never done   Pneumonia Vaccine (1 of 1 - PCV) Never done   Flu Shot  Never done   COVID-19 Vaccine (1 - 2023-24 season) Never done   DEXA scan (bone density measurement)  Completed   HPV Vaccine  Aged Out   Recent MMSE 26/30 09/13/2023, lives in SNF at this time, discussed vaccinations, will look into Prevnar 20, unknown when last bone density was> will try to find last study

## 2023-09-27 LAB — BASIC METABOLIC PANEL
BUN: 44 — AB (ref 4–21)
CO2: 25 — AB (ref 13–22)
Chloride: 108 (ref 99–108)
Creatinine: 1.5 — AB (ref 0.5–1.1)
Glucose: 76
Potassium: 4.4 meq/L (ref 3.5–5.1)
Sodium: 138 (ref 137–147)

## 2023-09-27 LAB — COMPREHENSIVE METABOLIC PANEL
Calcium: 8.6 — AB (ref 8.7–10.7)
eGFR: 33

## 2023-09-28 ENCOUNTER — Non-Acute Institutional Stay (SKILLED_NURSING_FACILITY): Payer: Self-pay | Admitting: Orthopedic Surgery

## 2023-09-28 ENCOUNTER — Encounter: Payer: Self-pay | Admitting: Orthopedic Surgery

## 2023-09-28 DIAGNOSIS — R339 Retention of urine, unspecified: Secondary | ICD-10-CM | POA: Diagnosis not present

## 2023-09-28 DIAGNOSIS — I10 Essential (primary) hypertension: Secondary | ICD-10-CM | POA: Diagnosis not present

## 2023-09-28 DIAGNOSIS — N1832 Chronic kidney disease, stage 3b: Secondary | ICD-10-CM

## 2023-09-28 NOTE — Progress Notes (Signed)
Location:  [facility] Room Number: 28/A Place of Service:  SNF (31) Provider:  Octavia Heir, NP   No primary care provider on file.  No care team member to display  Extended Emergency Contact Information Primary Emergency Contact: Knott,[name] Mobile Phone: [phone] Relation: Other Secondary Emergency Contact: Mazeeva,Natalia Mobile Phone: [phone] Relation: Friend  Code Status:  DNR Goals of care: Advanced Directive information    09/24/2023   10:45 AM  Advanced Directives  Does Patient Have a Medical Advance Directive? Yes  Type of Advance Directive Out of facility DNR (pink MOST or yellow form)  Does patient want to make changes to medical advance directive? No - Patient declined     Chief Complaint  Patient presents with   Acute Visit    Urinary retention    HPI:  Pt is a 89 y.o. female seen today for acute visit due to urinary retention.   She currently resides on the skilled nursing unit at [facility]. PMH: HTN, CKD, anemia, ankle pain and gait abnormality.    Recently hospitalized 10/27-10/31 due to UTI/AKI.    Recent BUN/creat 44/1.5 09/27/2023.   Off antihypertensives. Remains on hydralazine prn.   Foley was placed during hospitalization due to weakness and urinary retention. She is now 1+ assist when ambulating and able to transfer with minimal assist. No recent falls or injuries.   Eating and drinking well.   Continues to wait for bed at [facility].     No past medical history on file. No past surgical history on file.  Allergies  Allergen Reactions   Amoxicillin Nausea And Vomiting   Ivp Dye [Iodinated Contrast Media]     Outpatient Encounter Medications as of 09/28/2023  Medication Sig   acetaminophen (TYLENOL) 325 MG tablet Take 2 tablets (650 mg total) by mouth every 6 (six) hours as needed for mild pain (pain score 1-3) (or Fever >/= 101).   diclofenac Sodium (VOLTAREN) 1 % GEL Apply 2 g topically every 8  (eight) hours as needed.   Docusate Sodium (COLACE PO) Take 1 tablet by mouth daily as needed.   hydrALAZINE (APRESOLINE) 10 MG tablet Take 10 mg by mouth daily as needed.   Zinc Oxide 10 % OINT Apply 1 Application topically every 2 (two) hours as needed.   No facility-administered encounter medications on file as of 09/28/2023.    Review of Systems  Constitutional:  Negative for activity change and appetite change.  Respiratory:  Negative for cough and shortness of breath.   Cardiovascular:  Negative for chest pain and leg swelling.  Gastrointestinal:  Negative for abdominal distention and abdominal pain.  Genitourinary:  Negative for hematuria.       Foley  Musculoskeletal:  Positive for gait problem.  Neurological:  Positive for weakness. Negative for dizziness, light-headedness and headaches.  Psychiatric/Behavioral:  Negative for confusion and dysphoric mood. The patient is not nervous/anxious.      There is no immunization history on file for this patient. Pertinent  Health Maintenance Due  Topic Date Due   INFLUENZA VACCINE  Never done   DEXA SCAN  Completed      04/20/2023   10:04 AM 09/24/2023   12:12 PM  Fall Risk  Falls in the past year? 1 1  Was there an injury with Fall? 0 0  Fall Risk Category Calculator 2 1  Patient at Risk for Falls Due to History of fall(s);Impaired balance/gait History of fall(s);Impaired balance/gait;Impaired mobility  Fall risk Follow  up Falls evaluation completed Falls evaluation completed;Education provided   Functional Status Survey:    Vitals:   09/28/23 1510  BP: (!) 144/79  Pulse: (!) 52  Resp: 18  Temp: (!) 97.2 F (36.2 C)  SpO2: 97%  Weight: 122 lb 9.6 oz (55.6 kg)  Height: 5\' 2"  (1.575 m)   Body mass index is 22.42 kg/m. Physical Exam Vitals reviewed.  Constitutional:      General: She is not in acute distress. HENT:     Head: Normocephalic.  Eyes:     General:        Right eye: No discharge.        Left eye:  No discharge.  Cardiovascular:     Rate and Rhythm: Normal rate and regular rhythm.     Pulses: Normal pulses.     Heart sounds: Normal heart sounds.  Pulmonary:     Effort: Pulmonary effort is normal.     Breath sounds: Normal breath sounds.  Abdominal:     General: Bowel sounds are normal.     Palpations: Abdomen is soft.  Musculoskeletal:     Cervical back: Neck supple.     Right lower leg: No edema.     Left lower leg: No edema.  Skin:    General: Skin is warm.     Capillary Refill: Capillary refill takes less than 2 seconds.  Neurological:     General: No focal deficit present.     Mental Status: She is alert and oriented to person, place, and time.     Motor: Weakness present.     Gait: Gait abnormal.     Comments: 1+ assist with ambulating, minimal assist with transfer  Psychiatric:        Mood and Affect: Mood normal.     Labs reviewed: Recent Labs    09/05/23 2047 09/06/23 0259 09/06/23 0934 09/07/23 0326 09/08/23 0604 09/09/23 0522 09/16/23 0000  NA 132* 134*   < > 137 137 138 138  K 5.3* 5.4*   < > 4.6 4.0 4.4 4.2  CL 112* 115*   < > 112* 110 115* 107  CO2 11* 14*   < > 17* 20* 19* 25*  GLUCOSE 86 102*   < > 144* 106* 102*  --   BUN 110* 85*   < > 96* 89* 81* 54*  CREATININE 2.02* 2.06*   < > 2.09* 2.14* 1.96* 1.6*  CALCIUM 8.1* 7.8*   < > 7.6* 7.6* 7.8* 7.9*  MG 2.1 2.2  --  2.1  --   --   --   PHOS 4.5 4.4  --  3.1  --   --   --    < > = values in this interval not displayed.   Recent Labs    09/05/23 1411 09/06/23 0259 09/07/23 0326  AST 23 20 22   ALT 27 24 25   ALKPHOS 52 49 44  BILITOT 0.2* 0.9 0.7  PROT 5.5* 4.2* 4.1*  ALBUMIN 3.0* 2.0* 1.8*   Recent Labs    09/06/23 0259 09/07/23 0326 09/08/23 0604 09/16/23 0000  WBC 7.2 6.7 4.5 6.2  NEUTROABS  --  5.9 2.6 4,817.00  HGB 8.0* 7.1* 8.3* 9.3*  HCT 23.6* 20.6* 24.3* 28*  MCV 97.9 93.6 94.6  --   PLT 148* 177 182 201   Lab Results  Component Value Date   TSH 3.388 09/05/2023    No results found for: "HGBA1C" No results found for: "CHOL", "HDL", "LDLCALC", "  LDLDIRECT", "TRIG", "CHOLHDL"  Significant Diagnostic Results in last 30 days:  ECHOCARDIOGRAM COMPLETE  Result Date: 09/06/2023    ECHOCARDIOGRAM REPORT   Patient Name:   ASHELI MATYAS Date of Exam: 09/06/2023 Medical Rec #:  [id number]    Height:       63.0 in Accession #:    [id number]   Weight:       123.9 lb Date of Birth:  20-Feb-1932     BSA:          1.578 m Patient Age:    89 years     BP:           140/42 mmHg Patient Gender: F            HR:           88 bpm. Exam Location:  Inpatient Procedure: 2D Echo, Color Doppler and Cardiac Doppler Indications:    Murmur  History:        Patient has no prior history of Echocardiogram examinations.                 Signs/Symptoms:Murmur.  Sonographer:    Darlys Gales Referring Phys: [id number] ANASTASSIA DOUTOVA IMPRESSIONS  1. Left ventricular ejection fraction, by estimation, is 60 to 65%. The left ventricle has normal function. The left ventricle has no regional wall motion abnormalities. Left ventricular diastolic parameters were normal.  2. Right ventricular systolic function is normal. The right ventricular size is normal.  3. The mitral valve is normal in structure. No evidence of mitral valve regurgitation. No evidence of mitral stenosis.  4. The aortic valve is normal in structure. Aortic valve regurgitation is not visualized. No aortic stenosis is present.  5. The inferior vena cava is normal in size with greater than 50% respiratory variability, suggesting right atrial pressure of 3 mmHg. FINDINGS  Left Ventricle: Left ventricular ejection fraction, by estimation, is 60 to 65%. The left ventricle has normal function. The left ventricle has no regional wall motion abnormalities. The left ventricular internal cavity size was normal in size. There is  no left ventricular hypertrophy. Left ventricular diastolic parameters were normal. Right Ventricle: The right ventricular size is  normal. No increase in right ventricular wall thickness. Right ventricular systolic function is normal. Left Atrium: Left atrial size was normal in size. Right Atrium: Right atrial size was normal in size. Pericardium: There is no evidence of pericardial effusion. Mitral Valve: The mitral valve is normal in structure. No evidence of mitral valve regurgitation. No evidence of mitral valve stenosis. Tricuspid Valve: The tricuspid valve is normal in structure. Tricuspid valve regurgitation is not demonstrated. No evidence of tricuspid stenosis. Aortic Valve: The aortic valve is normal in structure. Aortic valve regurgitation is not visualized. No aortic stenosis is present. Aortic valve mean gradient measures 10.5 mmHg. Aortic valve peak gradient measures 18.9 mmHg. Aortic valve area, by VTI measures 2.09 cm. Pulmonic Valve: The pulmonic valve was normal in structure. Pulmonic valve regurgitation is not visualized. No evidence of pulmonic stenosis. Aorta: The aortic root is normal in size and structure. Venous: The inferior vena cava is normal in size with greater than 50% respiratory variability, suggesting right atrial pressure of 3 mmHg. IAS/Shunts: No atrial level shunt detected by color flow Doppler.  LEFT VENTRICLE PLAX 2D LVIDd:         4.50 cm   Diastology LVIDs:         2.70 cm   LV e' medial:    4.79  cm/s LV PW:         1.00 cm   LV E/e' medial:  14.4 LV IVS:        0.70 cm   LV e' lateral:   8.49 cm/s LVOT diam:     1.80 cm   LV E/e' lateral: 8.1 LV SV:         76 LV SV Index:   48 LVOT Area:     2.54 cm  RIGHT VENTRICLE RV S prime:     18.80 cm/s TAPSE (M-mode): 2.0 cm LEFT ATRIUM             Index        RIGHT ATRIUM           Index LA Vol (A2C):   33.2 ml 21.04 ml/m  RA Area:     12.50 cm LA Vol (A4C):   32.1 ml 20.35 ml/m  RA Volume:   24.10 ml  15.28 ml/m LA Biplane Vol: 35.4 ml 22.44 ml/m  AORTIC VALVE AV Area (Vmax):    1.83 cm AV Area (Vmean):   1.97 cm AV Area (VTI):     2.09 cm AV Vmax:            217.50 cm/s AV Vmean:          149.500 cm/s AV VTI:            0.363 m AV Peak Grad:      18.9 mmHg AV Mean Grad:      10.5 mmHg LVOT Vmax:         156.00 cm/s LVOT Vmean:        116.000 cm/s LVOT VTI:          0.298 m LVOT/AV VTI ratio: 0.82  AORTA Ao Root diam: 3.30 cm MITRAL VALVE                TRICUSPID VALVE MV Area (PHT): 2.49 cm     TR Peak grad:   24.2 mmHg MV Decel Time: 305 msec     TR Vmax:        246.00 cm/s MV E velocity: 69.00 cm/s MV A velocity: 100.00 cm/s  SHUNTS MV E/A ratio:  0.69         Systemic VTI:  0.30 m                             Systemic Diam: 1.80 cm Aditya Sabharwal Electronically signed by Dorthula Nettles Signature Date/Time: 09/06/2023/1:20:46 PM    Final    DG Chest Port 1 View  Result Date: 09/05/2023 CLINICAL DATA:  Shortness of breath and weakness EXAM: PORTABLE CHEST 1 VIEW COMPARISON:  Chest CT 08/30/2029 FINDINGS: The heart size and mediastinal contours are within normal limits. Both lungs are clear. The visualized skeletal structures are unremarkable. IMPRESSION: No active disease. Electronically Signed   By: Gaylyn Rong M.D.   On: 09/05/2023 14:36    Assessment/Plan 1. Urinary retention - foley placed last hospitalization due to possible hospice consult - now doing well in SNF - ambulation improved - discontinue foley - start voiding trial> if < 100 cc urine out put within 6 hours I/O cath> repeat trial, if second I/O cath leave foley in place and notify PCP - consider Flomax if retention continues  2. Chronic kidney disease, stage 3b (HCC) - improved  - now BUN/creat 44/1.5, GFR 33 09/27/2023 - encourage hydration with water -  avoid NSAIDS  3. Essential (primary) hypertension - stable without medication - cont hydralazine prn for SBP > 180    Family/ staff Communication: plan discussed with patient and nurse  Labs/tests ordered:  none

## 2023-09-29 ENCOUNTER — Non-Acute Institutional Stay (SKILLED_NURSING_FACILITY): Payer: Medicare Other | Admitting: Orthopedic Surgery

## 2023-09-29 ENCOUNTER — Encounter: Payer: Self-pay | Admitting: Orthopedic Surgery

## 2023-09-29 DIAGNOSIS — R339 Retention of urine, unspecified: Secondary | ICD-10-CM

## 2023-09-29 DIAGNOSIS — R531 Weakness: Secondary | ICD-10-CM | POA: Diagnosis not present

## 2023-09-29 DIAGNOSIS — N179 Acute kidney failure, unspecified: Secondary | ICD-10-CM

## 2023-09-29 DIAGNOSIS — K5901 Slow transit constipation: Secondary | ICD-10-CM | POA: Diagnosis not present

## 2023-09-29 NOTE — Progress Notes (Signed)
Location:  [facility] Room Number: 28/A Place of Service:  SNF (31) Provider:  Octavia Heir, NP   No primary care provider on file.  No care team member to display  Extended Emergency Contact Information Primary Emergency Contact: Knott,[name] Mobile Phone: [phone] Relation: Other Secondary Emergency Contact: Mazeeva,Natalia Mobile Phone: [phone] Relation: Friend  Code Status:  DNR Goals of care: Advanced Directive information    09/24/2023   10:45 AM  Advanced Directives  Does Patient Have a Medical Advance Directive? Yes  Type of Advance Directive Out of facility DNR (pink MOST or yellow form)  Does patient want to make changes to medical advance directive? No - Patient declined     Chief Complaint  Patient presents with   Acute Visit    constipation    HPI:  Pt is a 89 y.o. female seen today for acute visit due to constipation.   She currently resides on the skilled nursing unit at [facility]. PMH: HTN, CKD, anemia, ankle pain and gait abnormality.   No bowel movement x 3 days. She denies abdominal pain, N/V or diarrhea. H/o constipation in past, reports past episodes resolved with eating more vegetables. She has colace prn ordered. We discussed miralax and she refused. She is drinking water well.   Recently hospitalized 10/27-10/31 due to UTI/AKI. She became weak during/after hospitalization. Mobility is improving per PT/OT. I observed her ambulate halls without difficulty.   Foley discontinued yesterday. She is able to urinate without difficulty.    No past medical history on file. No past surgical history on file.  Allergies  Allergen Reactions   Amoxicillin Nausea And Vomiting   Ivp Dye [Iodinated Contrast Media]     Outpatient Encounter Medications as of 09/29/2023  Medication Sig   acetaminophen (TYLENOL) 325 MG tablet Take 2 tablets (650 mg total) by mouth every 6 (six) hours as needed for mild pain (pain score  1-3) (or Fever >/= 101).   diclofenac Sodium (VOLTAREN) 1 % GEL Apply 2 g topically every 8 (eight) hours as needed.   Docusate Sodium (COLACE PO) Take 1 tablet by mouth daily as needed.   hydrALAZINE (APRESOLINE) 10 MG tablet Take 10 mg by mouth daily as needed.   Zinc Oxide 10 % OINT Apply 1 Application topically every 2 (two) hours as needed.   No facility-administered encounter medications on file as of 09/29/2023.    Review of Systems  Constitutional:  Negative for activity change and appetite change.  Respiratory:  Negative for cough, shortness of breath and wheezing.   Cardiovascular:  Negative for chest pain and leg swelling.  Gastrointestinal:  Positive for constipation. Negative for abdominal distention, abdominal pain, diarrhea, nausea and vomiting.  Genitourinary:  Negative for dysuria, frequency and hematuria.  Musculoskeletal:  Positive for gait problem.  Neurological:  Positive for weakness. Negative for dizziness and light-headedness.  Psychiatric/Behavioral:  Negative for confusion and dysphoric mood. The patient is not nervous/anxious.      There is no immunization history on file for this patient. Pertinent  Health Maintenance Due  Topic Date Due   INFLUENZA VACCINE  Never done   DEXA SCAN  Completed      04/20/2023   10:04 AM 09/24/2023   12:12 PM  Fall Risk  Falls in the past year? 1 1  Was there an injury with Fall? 0 0  Fall Risk Category Calculator 2 1  Patient at Risk for Falls Due to History of fall(s);Impaired balance/gait History  of fall(s);Impaired balance/gait;Impaired mobility  Fall risk Follow up Falls evaluation completed Falls evaluation completed;Education provided   Functional Status Survey:    Vitals:   09/29/23 1649  BP: (!) 156/88  Pulse: (!) 55  Resp: 16  Temp: (!) 97.4 F (36.3 C)  SpO2: 96%  Weight: 122 lb 9.6 oz (55.6 kg)  Height: 5\' 2"  (1.575 m)   Body mass index is 22.42 kg/m. Physical Exam Vitals reviewed.   Constitutional:      General: She is not in acute distress. HENT:     Head: Normocephalic.  Eyes:     General:        Right eye: No discharge.        Left eye: No discharge.  Cardiovascular:     Rate and Rhythm: Normal rate and regular rhythm.     Pulses: Normal pulses.     Heart sounds: Normal heart sounds.  Pulmonary:     Effort: Pulmonary effort is normal.     Breath sounds: Normal breath sounds.  Abdominal:     General: Bowel sounds are normal. There is no distension.     Palpations: Abdomen is soft.     Tenderness: There is no abdominal tenderness.  Musculoskeletal:     Cervical back: Neck supple.     Right lower leg: No edema.     Left lower leg: No edema.  Skin:    General: Skin is warm.     Capillary Refill: Capillary refill takes less than 2 seconds.  Neurological:     General: No focal deficit present.     Mental Status: She is alert and oriented to person, place, and time.     Motor: Weakness present.     Gait: Gait abnormal.  Psychiatric:        Mood and Affect: Mood normal.     Labs reviewed: Recent Labs    09/05/23 2047 09/06/23 0259 09/06/23 0934 09/07/23 0326 09/08/23 0604 09/09/23 0522 09/16/23 0000  NA 132* 134*   < > 137 137 138 138  K 5.3* 5.4*   < > 4.6 4.0 4.4 4.2  CL 112* 115*   < > 112* 110 115* 107  CO2 11* 14*   < > 17* 20* 19* 25*  GLUCOSE 86 102*   < > 144* 106* 102*  --   BUN 110* 85*   < > 96* 89* 81* 54*  CREATININE 2.02* 2.06*   < > 2.09* 2.14* 1.96* 1.6*  CALCIUM 8.1* 7.8*   < > 7.6* 7.6* 7.8* 7.9*  MG 2.1 2.2  --  2.1  --   --   --   PHOS 4.5 4.4  --  3.1  --   --   --    < > = values in this interval not displayed.   Recent Labs    09/05/23 1411 09/06/23 0259 09/07/23 0326  AST 23 20 22   ALT 27 24 25   ALKPHOS 52 49 44  BILITOT 0.2* 0.9 0.7  PROT 5.5* 4.2* 4.1*  ALBUMIN 3.0* 2.0* 1.8*   Recent Labs    09/06/23 0259 09/07/23 0326 09/08/23 0604 09/16/23 0000  WBC 7.2 6.7 4.5 6.2  NEUTROABS  --  5.9 2.6  4,817.00  HGB 8.0* 7.1* 8.3* 9.3*  HCT 23.6* 20.6* 24.3* 28*  MCV 97.9 93.6 94.6  --   PLT 148* 177 182 201   Lab Results  Component Value Date   TSH 3.388 09/05/2023   No results found  for: "HGBA1C" No results found for: "CHOL", "HDL", "LDLCALC", "LDLDIRECT", "TRIG", "CHOLHDL"  Significant Diagnostic Results in last 30 days:  ECHOCARDIOGRAM COMPLETE  Result Date: 09/06/2023    ECHOCARDIOGRAM REPORT   Patient Name:   XENIA GILGENBACH Date of Exam: 09/06/2023 Medical Rec #:  [id number]    Height:       63.0 in Accession #:    [id number]   Weight:       123.9 lb Date of Birth:  [birth year]     BSA:          1.578 m Patient Age:    89 years     BP:           140/42 mmHg Patient Gender: F            HR:           88 bpm. Exam Location:  Inpatient Procedure: 2D Echo, Color Doppler and Cardiac Doppler Indications:    Murmur  History:        Patient has no prior history of Echocardiogram examinations.                 Signs/Symptoms:Murmur.  Sonographer:    Darlys Gales Referring Phys: [id number] ANASTASSIA DOUTOVA IMPRESSIONS  1. Left ventricular ejection fraction, by estimation, is 60 to 65%. The left ventricle has normal function. The left ventricle has no regional wall motion abnormalities. Left ventricular diastolic parameters were normal.  2. Right ventricular systolic function is normal. The right ventricular size is normal.  3. The mitral valve is normal in structure. No evidence of mitral valve regurgitation. No evidence of mitral stenosis.  4. The aortic valve is normal in structure. Aortic valve regurgitation is not visualized. No aortic stenosis is present.  5. The inferior vena cava is normal in size with greater than 50% respiratory variability, suggesting right atrial pressure of 3 mmHg. FINDINGS  Left Ventricle: Left ventricular ejection fraction, by estimation, is 60 to 65%. The left ventricle has normal function. The left ventricle has no regional wall motion abnormalities. The left ventricular  internal cavity size was normal in size. There is  no left ventricular hypertrophy. Left ventricular diastolic parameters were normal. Right Ventricle: The right ventricular size is normal. No increase in right ventricular wall thickness. Right ventricular systolic function is normal. Left Atrium: Left atrial size was normal in size. Right Atrium: Right atrial size was normal in size. Pericardium: There is no evidence of pericardial effusion. Mitral Valve: The mitral valve is normal in structure. No evidence of mitral valve regurgitation. No evidence of mitral valve stenosis. Tricuspid Valve: The tricuspid valve is normal in structure. Tricuspid valve regurgitation is not demonstrated. No evidence of tricuspid stenosis. Aortic Valve: The aortic valve is normal in structure. Aortic valve regurgitation is not visualized. No aortic stenosis is present. Aortic valve mean gradient measures 10.5 mmHg. Aortic valve peak gradient measures 18.9 mmHg. Aortic valve area, by VTI measures 2.09 cm. Pulmonic Valve: The pulmonic valve was normal in structure. Pulmonic valve regurgitation is not visualized. No evidence of pulmonic stenosis. Aorta: The aortic root is normal in size and structure. Venous: The inferior vena cava is normal in size with greater than 50% respiratory variability, suggesting right atrial pressure of 3 mmHg. IAS/Shunts: No atrial level shunt detected by color flow Doppler.  LEFT VENTRICLE PLAX 2D LVIDd:         4.50 cm   Diastology LVIDs:         2.70 cm  LV e' medial:    4.79 cm/s LV PW:         1.00 cm   LV E/e' medial:  14.4 LV IVS:        0.70 cm   LV e' lateral:   8.49 cm/s LVOT diam:     1.80 cm   LV E/e' lateral: 8.1 LV SV:         76 LV SV Index:   48 LVOT Area:     2.54 cm  RIGHT VENTRICLE RV S prime:     18.80 cm/s TAPSE (M-mode): 2.0 cm LEFT ATRIUM             Index        RIGHT ATRIUM           Index LA Vol (A2C):   33.2 ml 21.04 ml/m  RA Area:     12.50 cm LA Vol (A4C):   32.1 ml 20.35  ml/m  RA Volume:   24.10 ml  15.28 ml/m LA Biplane Vol: 35.4 ml 22.44 ml/m  AORTIC VALVE AV Area (Vmax):    1.83 cm AV Area (Vmean):   1.97 cm AV Area (VTI):     2.09 cm AV Vmax:           217.50 cm/s AV Vmean:          149.500 cm/s AV VTI:            0.363 m AV Peak Grad:      18.9 mmHg AV Mean Grad:      10.5 mmHg LVOT Vmax:         156.00 cm/s LVOT Vmean:        116.000 cm/s LVOT VTI:          0.298 m LVOT/AV VTI ratio: 0.82  AORTA Ao Root diam: 3.30 cm MITRAL VALVE                TRICUSPID VALVE MV Area (PHT): 2.49 cm     TR Peak grad:   24.2 mmHg MV Decel Time: 305 msec     TR Vmax:        246.00 cm/s MV E velocity: 69.00 cm/s MV A velocity: 100.00 cm/s  SHUNTS MV E/A ratio:  0.69         Systemic VTI:  0.30 m                             Systemic Diam: 1.80 cm Aditya Sabharwal Electronically signed by Dorthula Nettles Signature Date/Time: 09/06/2023/1:20:46 PM    Final    DG Chest Port 1 View  Result Date: 09/05/2023 CLINICAL DATA:  Shortness of breath and weakness EXAM: PORTABLE CHEST 1 VIEW COMPARISON:  Chest CT 08/30/2029 FINDINGS: The heart size and mediastinal contours are within normal limits. Both lungs are clear. The visualized skeletal structures are unremarkable. IMPRESSION: No active disease. Electronically Signed   By: Gaylyn Rong M.D.   On: 09/05/2023 14:36    Assessment/Plan 1. Slow transit constipation - no BM x 3 days - suspect due to decreased mobility - refused miralax - asymptomatic - abdomen soft, active bowel sounds - discussed prune juice and increasing vegetables/fruits - advised to take colace prn x 3 days  2. Generalized weakness - improving - cont PT/OT  3. AKI (acute kidney injury) (HCC) - recent BUN/creat 44/1.5, GFR 33 09/27/2023 - encourage hydration with water - avoid NSAIDS  4. Urinary  retention - foley discontinued 11/19 - urinating without complication    Family/ staff Communication: plan discussed with patient and  nurse  Labs/tests ordered:  none

## 2023-10-22 ENCOUNTER — Non-Acute Institutional Stay (SKILLED_NURSING_FACILITY): Payer: Self-pay | Admitting: Orthopedic Surgery

## 2023-10-22 ENCOUNTER — Encounter: Payer: Self-pay | Admitting: Orthopedic Surgery

## 2023-10-22 DIAGNOSIS — I10 Essential (primary) hypertension: Secondary | ICD-10-CM | POA: Diagnosis not present

## 2023-10-22 DIAGNOSIS — N1832 Chronic kidney disease, stage 3b: Secondary | ICD-10-CM

## 2023-10-22 DIAGNOSIS — K5901 Slow transit constipation: Secondary | ICD-10-CM | POA: Diagnosis not present

## 2023-10-22 DIAGNOSIS — R531 Weakness: Secondary | ICD-10-CM | POA: Diagnosis not present

## 2023-10-22 NOTE — Progress Notes (Signed)
Location:  [facility] Room Number: 28/A Place of Service:  SNF (31) Provider:  Octavia Heir, NP   No primary care provider on file.  No care team member to display  Extended Emergency Contact Information Primary Emergency Contact: Knott,[name] Mobile Phone: [phone] Relation: Other Secondary Emergency Contact: Mazeeva,Natalia Mobile Phone: [phone] Relation: Friend  Code Status:  DNR Goals of care: Advanced Directive information    09/24/2023   10:45 AM  Advanced Directives  Does Patient Have a Medical Advance Directive? Yes  Type of Advance Directive Out of facility DNR (pink MOST or yellow form)  Does patient want to make changes to medical advance directive? No - Patient declined     Chief Complaint  Patient presents with   Medical Management of Chronic Issues    HPI:  Pt is a 89 y.o. female seen today for medical management of chronic diseases.    She currently resides on the skilled nursing unit at [facility]. PMH: HTN, CKD, anemia, ankle pain and gait abnormality.    Recently hospitalized 10/27-10/31 due to UTI/AKI, BUN/creat 129/2.22, GFR 21 on admission/ BUN/creat 81/1.96 at discharge. Rechecked BUN/creat 44/1.52 09/27/2023. She is drinking fluids well.   Elevated blood pressure readings per nursing. See trends below. Patient denies chest pain, sob, HA, dizziness. She has hydralazine prn ordered, but refusing.   Remains on colace prn for constipation.   Followed by PT/OT. She is able to transfer from w/c to toilet on her own. No recent falls. Plans to transfer to [facility] when bed available. Her goal is to go back to [place].   Recent weights:  12/06- 117.6 lbs  11/06- 125.7 lbs  10/31- 125.5 lbs  Recent blood pressures:  12/10- 143/79  12/03- 139/72  11/26- 190/98       No past medical history on file. No past surgical history on file.  Allergies  Allergen Reactions   Amoxicillin Nausea And Vomiting   Ivp Dye  [Iodinated Contrast Media]     Outpatient Encounter Medications as of 10/22/2023  Medication Sig   acetaminophen (TYLENOL) 325 MG tablet Take 2 tablets (650 mg total) by mouth every 6 (six) hours as needed for mild pain (pain score 1-3) (or Fever >/= 101).   diclofenac Sodium (VOLTAREN) 1 % GEL Apply 2 g topically every 8 (eight) hours as needed.   Docusate Sodium (COLACE PO) Take 1 tablet by mouth daily as needed.   hydrALAZINE (APRESOLINE) 10 MG tablet Take 10 mg by mouth daily as needed.   Zinc Oxide 10 % OINT Apply 1 Application topically every 2 (two) hours as needed.   No facility-administered encounter medications on file as of 10/22/2023.    Review of Systems  Constitutional: Negative.   HENT: Negative.    Eyes: Negative.   Respiratory: Negative.    Cardiovascular: Negative.   Gastrointestinal: Negative.   Endocrine: Negative.   Genitourinary: Negative.   Musculoskeletal:  Positive for arthralgias and gait problem.  Neurological:  Positive for weakness.  Hematological: Negative.   Psychiatric/Behavioral: Negative.       There is no immunization history on file for this patient. Pertinent  Health Maintenance Due  Topic Date Due   INFLUENZA VACCINE  Never done   DEXA SCAN  Completed      04/20/2023   10:04 AM 09/24/2023   12:12 PM  Fall Risk  Falls in the past year? 1 1  Was there an injury with Fall? 0 0  Fall Risk  Category Calculator 2 1  Patient at Risk for Falls Due to History of fall(s);Impaired balance/gait History of fall(s);Impaired balance/gait;Impaired mobility  Fall risk Follow up Falls evaluation completed Falls evaluation completed;Education provided   Functional Status Survey:    Vitals:   10/22/23 1456  BP: (!) 143/79  Pulse: 71  Resp: 18  Temp: (!) 96.8 F (36 C)  SpO2: 99%  Weight: 117 lb 9.6 oz (53.3 kg)  Height: 5\' 2"  (1.575 m)   Body mass index is 21.51 kg/m. Physical Exam Vitals reviewed.  Constitutional:      General: She is  not in acute distress. HENT:     Head: Normocephalic.  Eyes:     General:        Right eye: No discharge.        Left eye: No discharge.  Cardiovascular:     Rate and Rhythm: Normal rate and regular rhythm.     Pulses: Normal pulses.     Heart sounds: Normal heart sounds.  Pulmonary:     Effort: Pulmonary effort is normal.     Breath sounds: Normal breath sounds.  Abdominal:     General: Bowel sounds are normal.     Palpations: Abdomen is soft.  Musculoskeletal:     Cervical back: Neck supple.     Right lower leg: No edema.     Left lower leg: No edema.  Skin:    General: Skin is warm.     Capillary Refill: Capillary refill takes less than 2 seconds.  Neurological:     General: No focal deficit present.     Mental Status: She is alert and oriented to person, place, and time.     Motor: Weakness present.     Gait: Gait abnormal.     Comments: wheelchair  Psychiatric:        Mood and Affect: Mood normal.     Labs reviewed: Recent Labs    09/05/23 2047 09/06/23 0259 09/06/23 0934 09/07/23 0326 09/08/23 0604 09/09/23 0522 09/16/23 0000  NA 132* 134*   < > 137 137 138 138  K 5.3* 5.4*   < > 4.6 4.0 4.4 4.2  CL 112* 115*   < > 112* 110 115* 107  CO2 11* 14*   < > 17* 20* 19* 25*  GLUCOSE 86 102*   < > 144* 106* 102*  --   BUN 110* 85*   < > 96* 89* 81* 54*  CREATININE 2.02* 2.06*   < > 2.09* 2.14* 1.96* 1.6*  CALCIUM 8.1* 7.8*   < > 7.6* 7.6* 7.8* 7.9*  MG 2.1 2.2  --  2.1  --   --   --   PHOS 4.5 4.4  --  3.1  --   --   --    < > = values in this interval not displayed.   Recent Labs    09/05/23 1411 09/06/23 0259 09/07/23 0326  AST 23 20 22   ALT 27 24 25   ALKPHOS 52 49 44  BILITOT 0.2* 0.9 0.7  PROT 5.5* 4.2* 4.1*  ALBUMIN 3.0* 2.0* 1.8*   Recent Labs    09/06/23 0259 09/07/23 0326 09/08/23 0604 09/16/23 0000  WBC 7.2 6.7 4.5 6.2  NEUTROABS  --  5.9 2.6 4,817.00  HGB 8.0* 7.1* 8.3* 9.3*  HCT 23.6* 20.6* 24.3* 28*  MCV 97.9 93.6 94.6  --    PLT 148* 177 182 201   Lab Results  Component Value Date  TSH 3.388 09/05/2023   No results found for: "HGBA1C" No results found for: "CHOL", "HDL", "LDLCALC", "LDLDIRECT", "TRIG", "CHOLHDL"  Significant Diagnostic Results in last 30 days:  No results found.  Assessment/Plan 1. Chronic kidney disease, stage 3b (HCC) (Primary) - Recently hospitalized 10/27-10/31 due to UTI/AKI - BUN/creat 129/2.22, GFR 21 on admission  - BUN/creat 81/1.96 at discharge - BUN/creat 44/1.52 09/27/2023 - drinking water well - avoid NSAIDS - recheck bmp 12/19  2. Primary hypertension - controlled without medication - recommend rechecking with manual blood pressure for high readings - asymptomatic - cont hydralazine prn  3. Generalized weakness - ongoing - able to transfer to commode on own - cont PT/OT - goal to discharge to [place] if possible  4. Slow transit constipation - abdomen soft - cont colace prn    Family/ staff Communication: plan discussed with patient and nurse  Labs/tests ordered:  12/19

## 2023-10-28 ENCOUNTER — Encounter: Payer: Self-pay | Admitting: *Deleted

## 2023-10-28 LAB — COMPREHENSIVE METABOLIC PANEL
Calcium: 9.4 (ref 8.7–10.7)
eGFR: 25

## 2023-10-28 LAB — BASIC METABOLIC PANEL
BUN: 72 — AB (ref 4–21)
CO2: 21 (ref 13–22)
Chloride: 108 (ref 99–108)
Creatinine: 1.9 — AB (ref 0.5–1.1)
Glucose: 76
Potassium: 5.3 meq/L — AB (ref 3.5–5.1)
Sodium: 138 (ref 137–147)

## 2023-11-23 ENCOUNTER — Encounter: Payer: Self-pay | Admitting: Adult Health

## 2023-11-23 ENCOUNTER — Non-Acute Institutional Stay (SKILLED_NURSING_FACILITY): Payer: Self-pay | Admitting: Adult Health

## 2023-11-23 DIAGNOSIS — N184 Chronic kidney disease, stage 4 (severe): Secondary | ICD-10-CM

## 2023-11-23 DIAGNOSIS — I10 Essential (primary) hypertension: Secondary | ICD-10-CM | POA: Diagnosis not present

## 2023-11-23 MED ORDER — CLONIDINE HCL 0.1 MG PO TABS: 0.1000 mg | ORAL_TABLET | Freq: Once | ORAL | 0 refills | Status: DC

## 2023-11-23 NOTE — Progress Notes (Signed)
 Location:  [facility] Room Number: 28 A Place of Service:  SNF (31) Provider:  Medina-Vargas, Eimi Viney, DNP, FNP-BC  No care team member to display  Extended Emergency Contact Information Primary Emergency Contact: Knott,[name] Mobile Phone: [phone] Relation: Other Secondary Emergency Contact: Mazeeva,Natalia Mobile Phone: [phone] Relation: Friend  Code Status:  Limited DNR  Goals of care: Advanced Directive information    09/24/2023   10:45 AM  Advanced Directives  Does Patient Have a Medical Advance Directive? Yes  Type of Advance Directive Out of facility DNR (pink MOST or yellow form)  Does patient want to make changes to medical advance directive? No - Patient declined     Chief Complaint  Patient presents with   Acute Visit    Elevated BP    HPI:  Pt is a 89 y.o. female seen today for for an acute visit regarding elevated BP. She is a resident of [id number]. Charge nurse reported that her BP 206/95 but refusing to take PRN Hydralazine . She was previously taking Losartan  which was discontinued due to worsening kidney function with latest GFR 25, 10/27/24. She stated that she doesn't like Hydralazine . Repeat BP manually and  was 190/90. She denies headache nor chest pains. She is wanting to take Losartan . Discussed with her that Losartan  is contraindicated with low GFR/CKD stage 4. She said that she is still not taking Hydralazine . Discussed that Clonidine  was ordered if she does not like Hydralazine . She continued to refuse Clonidine .   No past medical history on file. No past surgical history on file.  Allergies  Allergen Reactions   Amoxicillin Nausea And Vomiting   Ivp Dye [Iodinated Contrast Media]     Outpatient Encounter Medications as of 11/23/2023  Medication Sig   [DISCONTINUED] cloNIDine  (CATAPRES ) 0.1 MG tablet Take 1 tablet (0.1 mg total) by mouth once for 1 dose.   acetaminophen  (TYLENOL ) 325 MG tablet Take  2 tablets (650 mg total) by mouth every 6 (six) hours as needed for mild pain (pain score 1-3) (or Fever >/= 101).   cloNIDine  (CATAPRES ) 0.1 MG tablet Take 1 tablet (0.1 mg total) by mouth once for 1 dose.   diclofenac  Sodium (VOLTAREN ) 1 % GEL Apply 2 g topically every 8 (eight) hours as needed.   Docusate Sodium (COLACE PO) Take 1 tablet by mouth daily as needed.   hydrALAZINE  (APRESOLINE ) 10 MG tablet Take 10 mg by mouth daily as needed.   Zinc Oxide 10 % OINT Apply 1 Application topically every 2 (two) hours as needed.   No facility-administered encounter medications on file as of 11/23/2023.    Review of Systems  Constitutional:  Negative for appetite change, chills, fatigue and fever.  HENT:  Negative for congestion, hearing loss, rhinorrhea and sore throat.   Eyes: Negative.   Respiratory:  Negative for cough, shortness of breath and wheezing.   Cardiovascular:  Negative for chest pain, palpitations and leg swelling.  Gastrointestinal:  Negative for abdominal pain, constipation, diarrhea, nausea and vomiting.  Genitourinary:  Negative for dysuria.  Musculoskeletal:  Negative for arthralgias, back pain and myalgias.  Skin:  Negative for color change, rash and wound.  Neurological:  Negative for dizziness, weakness and headaches.  Psychiatric/Behavioral:  Negative for behavioral problems. The patient is not nervous/anxious.        There is no immunization history on file for this patient. Pertinent  Health Maintenance Due  Topic Date Due   INFLUENZA VACCINE  Never done  DEXA SCAN  Completed      04/20/2023   10:04 AM 09/24/2023   12:12 PM 10/22/2023    3:08 PM  Fall Risk  Falls in the past year? 1 1 1   Was there an injury with Fall? 0 0 0  Fall Risk Category Calculator 2 1 1   Patient at Risk for Falls Due to History of fall(s);Impaired balance/gait History of fall(s);Impaired balance/gait;Impaired mobility History of fall(s);Impaired balance/gait;Impaired mobility  Fall  risk Follow up Falls evaluation completed Falls evaluation completed;Education provided Falls evaluation completed;Education provided     Vitals:   11/23/23 1647 11/23/23 1659  BP: (!) 206/95 (!) 190/90  Pulse: 62   Resp: 18   Temp: (!) 96.4 F (35.8 C)   SpO2: 99%   Weight: 119 lb 4.8 oz (54.1 kg)   Height: 5' 3 (1.6 m)    Body mass index is 21.13 kg/m.  Physical Exam Constitutional:      Appearance: Normal appearance.  HENT:     Head: Normocephalic and atraumatic.     Nose: Nose normal.     Mouth/Throat:     Mouth: Mucous membranes are moist.  Eyes:     Conjunctiva/sclera: Conjunctivae normal.  Cardiovascular:     Rate and Rhythm: Normal rate and regular rhythm.  Pulmonary:     Effort: Pulmonary effort is normal.     Breath sounds: Normal breath sounds.  Abdominal:     General: Bowel sounds are normal.     Palpations: Abdomen is soft.  Musculoskeletal:     Cervical back: Normal range of motion.     Right lower leg: Edema present.     Left lower leg: Edema present.     Comments: BLE 2+edema  Skin:    General: Skin is warm and dry.  Neurological:     General: No focal deficit present.     Mental Status: She is alert and oriented to person, place, and time.  Psychiatric:        Mood and Affect: Mood normal.        Behavior: Behavior normal.        Labs reviewed: Recent Labs    09/05/23 2047 09/06/23 0259 09/06/23 0934 09/07/23 0326 09/08/23 0604 09/09/23 0522 09/16/23 0000 09/27/23 0000 10/28/23 0000  NA 132* 134*   < > 137 137 138 138 138 138  K 5.3* 5.4*   < > 4.6 4.0 4.4 4.2 4.4 5.3*  CL 112* 115*   < > 112* 110 115* 107 108 108  CO2 11* 14*   < > 17* 20* 19* 25* 25* 21  GLUCOSE 86 102*   < > 144* 106* 102*  --   --   --   BUN 110* 85*   < > 96* 89* 81* 54* 44* 72*  CREATININE 2.02* 2.06*   < > 2.09* 2.14* 1.96* 1.6* 1.5* 1.9*  CALCIUM 8.1* 7.8*   < > 7.6* 7.6* 7.8* 7.9* 8.6* 9.4  MG 2.1 2.2  --  2.1  --   --   --   --   --   PHOS 4.5 4.4   --  3.1  --   --   --   --   --    < > = values in this interval not displayed.   Recent Labs    09/05/23 1411 09/06/23 0259 09/07/23 0326  AST 23 20 22   ALT 27 24 25   ALKPHOS 52 49 44  BILITOT 0.2* 0.9 0.7  PROT 5.5* 4.2* 4.1*  ALBUMIN 3.0* 2.0* 1.8*   Recent Labs    09/06/23 0259 09/07/23 0326 09/08/23 0604 09/16/23 0000  WBC 7.2 6.7 4.5 6.2  NEUTROABS  --  5.9 2.6 4,817.00  HGB 8.0* 7.1* 8.3* 9.3*  HCT 23.6* 20.6* 24.3* 28*  MCV 97.9 93.6 94.6  --   PLT 148* 177 182 201   Lab Results  Component Value Date   TSH 3.388 09/05/2023   No results found for: HGBA1C No results found for: CHOL, HDL, LDLCALC, LDLDIRECT, TRIG, CHOLHDL  Significant Diagnostic Results in last 30 days:  No results found.  Assessment/Plan  1. Uncontrolled hypertension (Primary) -  ordered Clonidine  0.1 mg X 1 dose but still refused it -  discussed possibility of CVA if BP is not controlled but continued to refuse medication -  monitor BP daily X 1 week - cloNIDine  (CATAPRES ) 0.1 MG tablet; Take 1 tablet (0.1 mg total) by mouth once for 1 dose.  Dispense: 1 tablet; Refill: 0  2. Chronic kidney disease, stage 4 (severe) (HCC) Lab Results  Component Value Date   NA 138 10/28/2023   K 5.3 (A) 10/28/2023   CO2 21 10/28/2023   GLUCOSE 102 (H) 09/09/2023   BUN 72 (A) 10/28/2023   CREATININE 1.9 (A) 10/28/2023   CALCIUM 9.4 10/28/2023   EGFR 25 10/28/2023   GFRNONAA 24 (L) 09/09/2023    -  will re-check BMP   Family/ staff Communication: Discussed plan of care with resident and charge nurse.  Labs/tests ordered: BMP    Filbert Craze Medina-Vargas, DNP, MSN, FNP-BC [facility] [phone] (Monday-Friday 8:00 a.m. - 5:00 p.m.) [phone] (after hours)

## 2023-11-29 ENCOUNTER — Non-Acute Institutional Stay (SKILLED_NURSING_FACILITY): Payer: Self-pay | Admitting: Orthopedic Surgery

## 2023-11-29 ENCOUNTER — Encounter: Payer: Self-pay | Admitting: Orthopedic Surgery

## 2023-11-29 DIAGNOSIS — R531 Weakness: Secondary | ICD-10-CM | POA: Diagnosis not present

## 2023-11-29 DIAGNOSIS — M25551 Pain in right hip: Secondary | ICD-10-CM

## 2023-11-29 DIAGNOSIS — I10 Essential (primary) hypertension: Secondary | ICD-10-CM

## 2023-11-29 DIAGNOSIS — N1832 Chronic kidney disease, stage 3b: Secondary | ICD-10-CM | POA: Diagnosis not present

## 2023-11-29 DIAGNOSIS — B372 Candidiasis of skin and nail: Secondary | ICD-10-CM | POA: Diagnosis not present

## 2023-11-29 LAB — BASIC METABOLIC PANEL WITH GFR
BUN: 76 — AB (ref 4–21)
CO2: 21 (ref 13–22)
Chloride: 110 — AB (ref 99–108)
Creatinine: 2.2 — AB (ref 0.5–1.1)
Glucose: 73
Potassium: 5 meq/L (ref 3.5–5.1)
Sodium: 139 (ref 137–147)

## 2023-11-29 LAB — CBC: RBC: 3.01 — AB (ref 3.87–5.11)

## 2023-11-29 LAB — CBC AND DIFFERENTIAL
HCT: 29 — AB (ref 36–46)
Hemoglobin: 9.8 — AB (ref 12.0–16.0)
Platelets: 221 10*3/uL (ref 150–400)
WBC: 4.1

## 2023-11-29 LAB — COMPREHENSIVE METABOLIC PANEL WITH GFR
Calcium: 9.4 (ref 8.7–10.7)
eGFR: 21

## 2023-11-29 MED ORDER — NYSTATIN 100000 UNIT/GM EX CREA: TOPICAL_CREAM | Freq: Two times a day (BID) | CUTANEOUS | Status: AC

## 2023-11-29 NOTE — Progress Notes (Signed)
Location:   [facility] Room Number: 28-A Place of Service:  SNF (31) Provider:  Hazle Nordmann, NP  No primary care provider on file.  No care team member to display  Extended Emergency Contact Information Primary Emergency Contact: Knott,[name] Mobile Phone: [phone] Relation: Other Secondary Emergency Contact: Mazeeva,Natalia Mobile Phone: [phone] Relation: Friend  Code Status:  DNR Goals of care: Advanced Directive information    11/29/2023   10:10 AM  Advanced Directives  Does Patient Have a Medical Advance Directive? Yes  Type of Estate agent of [place];Out of facility DNR (pink MOST or yellow form)  Does patient want to make changes to medical advance directive? No - Patient declined  Copy of Healthcare Power of Attorney in Chart? No - copy requested     Chief Complaint  Patient presents with   Medical Management of Chronic Issues    Routine Visit.    Immunizations    Discuss the need for Pne vaccine, DTAP vaccine, Shingrix vaccine, Influenza vaccine, and Covid vaccine    HPI:  Pt is a 89 y.o. female seen today for medical management of chronic diseases.    She currently resides on the skilled nursing unit at [facility]. PMH: HTN, CKD, anemia, ankle pain and gait abnormality.   CKD- hospitalized 10/27-10/31 due to UTI/AKI (BUN/creat 129/2.22), drinking water well> trying to drink 60 ounces daily, BUN/creat 72/1.87, GFR 25 10/28/2023 Generalized weakness/unstable gait- followed by PT/OT, no recent falls, 1+ minimal assist with transfers HTN- see trends below, asymptomatic, hydralazine and clonidine ordered but declined by patient Right hip pain- remains on tylenol and voltaren gel  Plan to transfer to [facility] when room is ready.   Recent blood pressures:  01/19- 145/74  01/18- 140/80  01/17- 143/75  Recent weights:  01/03- 119.3 lbs  12/01- 117.6 lbs  11/02- 125.7 lbs    History reviewed. No  pertinent past medical history. History reviewed. No pertinent surgical history.  Allergies  Allergen Reactions   Amoxicillin Nausea And Vomiting   Ivp Dye [Iodinated Contrast Media]     Allergies as of 11/29/2023       Reactions   Amoxicillin Nausea And Vomiting   Ivp Dye [iodinated Contrast Media]         Medication List        Accurate as of November 29, 2023 10:10 AM. If you have any questions, ask your nurse or doctor.          STOP taking these medications    cloNIDine 0.1 MG tablet Commonly known as: CATAPRES Stopped by: Abdoulie Tierce E Asra Gambrel       TAKE these medications    acetaminophen 325 MG tablet Commonly known as: TYLENOL Take 2 tablets (650 mg total) by mouth every 6 (six) hours as needed for mild pain (pain score 1-3) (or Fever >/= 101).   COLACE PO Take 1 tablet by mouth daily as needed.   diclofenac Sodium 1 % Gel Commonly known as: VOLTAREN Apply 2 g topically every 8 (eight) hours as needed.   hydrALAZINE 10 MG tablet Commonly known as: APRESOLINE Take 10 mg by mouth daily as needed.   Zinc Oxide 10 % Oint Apply 1 Application topically every 2 (two) hours as needed.        Review of Systems  Constitutional:  Negative for activity change and appetite change.  HENT:  Negative for sore throat and trouble swallowing.   Eyes:  Negative for visual disturbance.  Respiratory:  Negative for cough and shortness of breath.   Cardiovascular:  Negative for chest pain and leg swelling.  Gastrointestinal:  Negative for abdominal distention, abdominal pain and constipation.  Genitourinary:  Negative for dysuria, frequency and hematuria.  Musculoskeletal:  Positive for arthralgias and gait problem.  Skin:  Negative for color change.  Neurological:  Positive for weakness. Negative for dizziness and headaches.  Psychiatric/Behavioral:  Negative for confusion and dysphoric mood. The patient is not nervous/anxious.      There is no immunization history on  file for this patient. Pertinent  Health Maintenance Due  Topic Date Due   INFLUENZA VACCINE  Never done   DEXA SCAN  Completed      04/20/2023   10:04 AM 09/24/2023   12:12 PM 10/22/2023    3:08 PM  Fall Risk  Falls in the past year? 1 1 1   Was there an injury with Fall? 0 0 0  Fall Risk Category Calculator 2 1 1   Patient at Risk for Falls Due to History of fall(s);Impaired balance/gait History of fall(s);Impaired balance/gait;Impaired mobility History of fall(s);Impaired balance/gait;Impaired mobility  Fall risk Follow up Falls evaluation completed Falls evaluation completed;Education provided Falls evaluation completed;Education provided   Functional Status Survey:    Vitals:   11/29/23 1007  BP: (!) 145/74  Pulse: (!) 59  Resp: 18  Temp: (!) 97 F (36.1 C)  SpO2: 97%  Weight: 119 lb 4.8 oz (54.1 kg)  Height: 5\' 3"  (1.6 m)   Body mass index is 21.13 kg/m. Physical Exam Vitals reviewed.  Constitutional:      General: She is not in acute distress. HENT:     Head: Normocephalic.  Eyes:     General:        Right eye: No discharge.        Left eye: No discharge.  Cardiovascular:     Rate and Rhythm: Normal rate and regular rhythm.     Pulses: Normal pulses.     Heart sounds: Normal heart sounds.  Pulmonary:     Effort: Pulmonary effort is normal.     Breath sounds: Normal breath sounds.  Abdominal:     General: Bowel sounds are normal.     Palpations: Abdomen is soft.  Musculoskeletal:     Cervical back: Neck supple.     Right lower leg: No edema.     Left lower leg: No edema.  Skin:    General: Skin is warm.     Capillary Refill: Capillary refill takes less than 2 seconds.     Comments: Excoriated skin to groin folds  Neurological:     General: No focal deficit present.     Mental Status: She is alert and oriented to person, place, and time.     Motor: Weakness present.     Gait: Gait abnormal.     Comments: wheelchair  Psychiatric:        Mood and  Affect: Mood normal.     Labs reviewed: Recent Labs    09/05/23 2047 09/06/23 0259 09/06/23 0934 09/07/23 0326 09/08/23 0604 09/09/23 0522 09/16/23 0000 09/27/23 0000 10/28/23 0000  NA 132* 134*   < > 137 137 138 138 138 138  K 5.3* 5.4*   < > 4.6 4.0 4.4 4.2 4.4 5.3*  CL 112* 115*   < > 112* 110 115* 107 108 108  CO2 11* 14*   < > 17* 20* 19* 25* 25* 21  GLUCOSE 86 102*   < >  144* 106* 102*  --   --   --   BUN 110* 85*   < > 96* 89* 81* 54* 44* 72*  CREATININE 2.02* 2.06*   < > 2.09* 2.14* 1.96* 1.6* 1.5* 1.9*  CALCIUM 8.1* 7.8*   < > 7.6* 7.6* 7.8* 7.9* 8.6* 9.4  MG 2.1 2.2  --  2.1  --   --   --   --   --   PHOS 4.5 4.4  --  3.1  --   --   --   --   --    < > = values in this interval not displayed.   Recent Labs    09/05/23 1411 09/06/23 0259 09/07/23 0326  AST 23 20 22   ALT 27 24 25   ALKPHOS 52 49 44  BILITOT 0.2* 0.9 0.7  PROT 5.5* 4.2* 4.1*  ALBUMIN 3.0* 2.0* 1.8*   Recent Labs    09/06/23 0259 09/07/23 0326 09/08/23 0604 09/16/23 0000  WBC 7.2 6.7 4.5 6.2  NEUTROABS  --  5.9 2.6 4,817.00  HGB 8.0* 7.1* 8.3* 9.3*  HCT 23.6* 20.6* 24.3* 28*  MCV 97.9 93.6 94.6  --   PLT 148* 177 182 201   Lab Results  Component Value Date   TSH 3.388 09/05/2023   No results found for: "HGBA1C" No results found for: "CHOL", "HDL", "LDLCALC", "LDLDIRECT", "TRIG", "CHOLHDL"  Significant Diagnostic Results in last 30 days:  No results found.  Assessment/Plan 1. Candidal skin infection (Primary) - excoriated skin to groin - nystatin cream (MYCOSTATIN)  2. Chronic kidney disease, stage 3b (HCC) - ongoing - hospitalized 10/27-10/31 for UTI/AKI - BUN/creat 129/2.22, GFR 21> hosp admission - BUN/creat 81/1.96 at discharge - BUN/creat 44/1.52 09/27/2023 - BUN/creat 72/7.87 10/28/2023 - bmp drawn today - cont hydration with water> trying 60 ounces daily - avoid NSAIDS  3. Generalized weakness - improving - cont PT/OT  4. Essential (primary) hypertension -  controlled without medication  - suspect association with kidney disease - a few elevated readings> refused hydralazine/clonidine - discussed importance of manual blood pressures  5. Right hip pain - suspect OA - pain improves with moevemtn - cont tylenol and voltaren gel prn   Family/ staff Communication: plan discussed with patient and nurse  Labs/tests ordered:  none

## 2023-12-09 ENCOUNTER — Encounter: Payer: Self-pay | Admitting: Adult Health

## 2023-12-09 ENCOUNTER — Non-Acute Institutional Stay (SKILLED_NURSING_FACILITY): Payer: Medicare Other | Admitting: Adult Health

## 2023-12-09 DIAGNOSIS — R52 Pain, unspecified: Secondary | ICD-10-CM | POA: Diagnosis not present

## 2023-12-09 DIAGNOSIS — I129 Hypertensive chronic kidney disease with stage 1 through stage 4 chronic kidney disease, or unspecified chronic kidney disease: Secondary | ICD-10-CM | POA: Diagnosis not present

## 2023-12-09 MED ORDER — ACETAMINOPHEN 325 MG PO TABS: 650.0000 mg | ORAL_TABLET | Freq: Every day | ORAL | Status: AC

## 2023-12-09 MED ORDER — ACETAMINOPHEN 325 MG PO TABS: 650.0000 mg | ORAL_TABLET | Freq: Three times a day (TID) | ORAL | Status: DC | PRN

## 2023-12-09 NOTE — Progress Notes (Signed)
[facility]  [facility]  S[facility] (31) Provider:  Kenard Gower, DNP, FNP-BC  No care team member to display  Extended Emergency Contact Information Primary Emergency Contact: Knott,[name] Mobile Phone: [phone] Relation: Other Secondary Emergency Contact: Mazeeva,Natalia Mobile Phone: [phone] Relation: Friend  Code Status:  DNR  Goals of care: Advanced Directive information    11/29/2023   10:10 AM  Advanced Directives  Does Patient Have a Medical Advance Directive? Yes  Type of Estate agent of [place];Out of facility DNR (pink MOST or yellow form)  Does patient want to make changes to medical advance directive? No - Patient declined  Copy of Healthcare Power of Attorney in Chart? No - copy requested     Chief Complaint  Patient presents with   Acute Visit    Pain management    HPI:  Pt is a 89 y.o. female seen today for an acute visit regarding pain management. She is a resident of [id number].  She has significant medical history of chronic anemia, CKD IV and hypertension. She was reported by staff to be complaining of pain on her joints and would be asking for Acetaminophen daily. Latest GFR  21, taken 11/29/23. Patient is aware of her CKD. She currently has order for  Diclofenac gel 1% topically to bilateral knees Q 8 hours PRN. She stated that she has pain on her neck, shoulders, knees and feet.  BP 154/90, denies headache , dizziness nor SOB. She has Hydralazine PRN for SBP >180.   No past medical history on file. No past surgical history on file.  Allergies  Allergen Reactions   Amoxicillin Nausea And Vomiting   Ivp Dye [Iodinated Contrast Media]     Outpatient Encounter Medications as of 12/09/2023  Medication Sig   acetaminophen (TYLENOL) 325 MG tablet Take 2 tablets (650 mg total) by mouth daily for 7 days. At 10AM   acetaminophen (TYLENOL) 325 MG tablet Take 2 tablets (650 mg  total) by mouth every 8 (eight) hours as needed (or Fever >/= 101).   diclofenac Sodium (VOLTAREN) 1 % GEL Apply 2 g topically every 8 (eight) hours as needed.   Docusate Sodium (COLACE PO) Take 1 tablet by mouth daily as needed.   hydrALAZINE (APRESOLINE) 10 MG tablet Take 10 mg by mouth daily as needed.   Zinc Oxide 10 % OINT Apply 1 Application topically every 2 (two) hours as needed.   [DISCONTINUED] acetaminophen (TYLENOL) 325 MG tablet Take 2 tablets (650 mg total) by mouth every 6 (six) hours as needed for mild pain (pain score 1-3) (or Fever >/= 101).   No facility-administered encounter medications on file as of 12/09/2023.    Review of Systems  Constitutional:  Negative for appetite change, chills, fatigue and fever.  HENT:  Negative for congestion, hearing loss, rhinorrhea and sore throat.   Eyes: Negative.   Respiratory:  Negative for cough, shortness of breath and wheezing.   Cardiovascular:  Positive for leg swelling. Negative for chest pain and palpitations.  Gastrointestinal:  Negative for abdominal pain, constipation, diarrhea, nausea and vomiting.  Genitourinary:  Negative for dysuria.  Musculoskeletal:  Positive for arthralgias and neck pain. Negative for back pain and myalgias.  Skin:  Negative for color change, rash and wound.  Neurological:  Negative for dizziness, weakness and headaches.  Psychiatric/Behavioral:  Negative for behavioral problems. The patient is not nervous/anxious.       There is no immunization history on  file for this patient. Pertinent  Health Maintenance Due  Topic Date Due   INFLUENZA VACCINE  Never done   DEXA SCAN  Completed      04/20/2023   10:04 AM 09/24/2023   12:12 PM 10/22/2023    3:08 PM  Fall Risk  Falls in the past year? 1 1 1   Was there an injury with Fall? 0 0 0  Fall Risk Category Calculator 2 1 1   Patient at Risk for Falls Due to History of fall(s);Impaired balance/gait History of fall(s);Impaired balance/gait;Impaired  mobility History of fall(s);Impaired balance/gait;Impaired mobility  Fall risk Follow up Falls evaluation completed Falls evaluation completed;Education provided Falls evaluation completed;Education provided     Vitals:   12/09/23 1121  BP: (!) 154/90  Pulse: 64  Resp: 16  Temp: (!) 97.4 F (36.3 C)  SpO2: 97%  Weight: 119 lb 4.8 oz (54.1 kg)  Height: 5\' 3"  (1.6 m)   Body mass index is 21.13 kg/m.  Physical Exam Constitutional:      General: She is not in acute distress.    Appearance: Normal appearance.  HENT:     Head: Normocephalic and atraumatic.     Nose: Nose normal.     Mouth/Throat:     Mouth: Mucous membranes are moist.  Eyes:     Conjunctiva/sclera: Conjunctivae normal.  Cardiovascular:     Rate and Rhythm: Normal rate and regular rhythm.  Pulmonary:     Effort: Pulmonary effort is normal.     Breath sounds: Normal breath sounds.  Abdominal:     General: Bowel sounds are normal.     Palpations: Abdomen is soft.  Musculoskeletal:        General: Swelling present. Normal range of motion.     Cervical back: Normal range of motion.     Right lower leg: Edema present.     Left lower leg: Edema present.     Comments: BLE 2+edema  Skin:    General: Skin is warm and dry.  Neurological:     General: No focal deficit present.     Mental Status: She is alert and oriented to person, place, and time.  Psychiatric:        Mood and Affect: Mood normal.        Behavior: Behavior normal.        Thought Content: Thought content normal.        Judgment: Judgment normal.      Labs reviewed: Recent Labs    09/05/23 2047 09/06/23 0259 09/06/23 0934 09/07/23 0326 09/08/23 0604 09/09/23 0522 09/16/23 0000 09/27/23 0000 10/28/23 0000  NA 132* 134*   < > 137 137 138 138 138 138  K 5.3* 5.4*   < > 4.6 4.0 4.4 4.2 4.4 5.3*  CL 112* 115*   < > 112* 110 115* 107 108 108  CO2 11* 14*   < > 17* 20* 19* 25* 25* 21  GLUCOSE 86 102*   < > 144* 106* 102*  --   --   --    BUN 110* 85*   < > 96* 89* 81* 54* 44* 72*  CREATININE 2.02* 2.06*   < > 2.09* 2.14* 1.96* 1.6* 1.5* 1.9*  CALCIUM 8.1* 7.8*   < > 7.6* 7.6* 7.8* 7.9* 8.6* 9.4  MG 2.1 2.2  --  2.1  --   --   --   --   --   PHOS 4.5 4.4  --  3.1  --   --   --   --   --    < > =  values in this interval not displayed.   Recent Labs    09/05/23 1411 09/06/23 0259 09/07/23 0326  AST 23 20 22   ALT 27 24 25   ALKPHOS 52 49 44  BILITOT 0.2* 0.9 0.7  PROT 5.5* 4.2* 4.1*  ALBUMIN 3.0* 2.0* 1.8*   Recent Labs    09/06/23 0259 09/07/23 0326 09/08/23 0604 09/16/23 0000  WBC 7.2 6.7 4.5 6.2  NEUTROABS  --  5.9 2.6 4,817.00  HGB 8.0* 7.1* 8.3* 9.3*  HCT 23.6* 20.6* 24.3* 28*  MCV 97.9 93.6 94.6  --   PLT 148* 177 182 201   Lab Results  Component Value Date   TSH 3.388 09/05/2023   No results found for: "HGBA1C" No results found for: "CHOL", "HDL", "LDLCALC", "LDLDIRECT", "TRIG", "CHOLHDL"  Significant Diagnostic Results in last 30 days:  No results found.  Assessment/Plan  1. Generalized pain (Primary) -  GFR 21, 11/29/23 -  has pain on multiple joints -  continue Diclofenac gel 1% PRN - acetaminophen (TYLENOL) 325 MG tablet; Take 2 tablets (650 mg total) by mouth every 8 (eight) hours as needed (or Fever >/= 101)  - acetaminophen (TYLENOL) 325 MG tablet; Take 2 tablets (650 mg total) by mouth daily for 7 days. At 10AM  2.  Benign hypertension with chronic kidney disease Lab Results  Component Value Date   NA 138 10/28/2023   K 5.3 (A) 10/28/2023   CO2 21 10/28/2023   GLUCOSE 102 (H) 09/09/2023   BUN 72 (A) 10/28/2023   CREATININE 1.9 (A) 10/28/2023   CALCIUM 9.4 10/28/2023   EGFR 25 10/28/2023   GFRNONAA 24 (L) 09/09/2023    -  GFR 21, BUN 76, creatinine 2.22, K 5.0, 11/29/23 -  discussed with patient to avoid OTC like Acetaminophen -  will monitor    Family/ staff Communication: Discussed plan of care with resident and charge nurse.  Labs/tests ordered:  None    Kenard Gower, DNP, MSN, FNP-BC [facility] [phone] (Monday-Friday 8:00 a.m. - 5:00 p.m.) [phone] (after hours)

## 2024-01-06 ENCOUNTER — Non-Acute Institutional Stay (SKILLED_NURSING_FACILITY): Payer: Self-pay | Admitting: Internal Medicine

## 2024-01-06 DIAGNOSIS — R200 Anesthesia of skin: Secondary | ICD-10-CM

## 2024-01-06 DIAGNOSIS — M150 Primary generalized (osteo)arthritis: Secondary | ICD-10-CM | POA: Diagnosis not present

## 2024-01-06 DIAGNOSIS — R2681 Unsteadiness on feet: Secondary | ICD-10-CM

## 2024-01-06 DIAGNOSIS — I129 Hypertensive chronic kidney disease with stage 1 through stage 4 chronic kidney disease, or unspecified chronic kidney disease: Secondary | ICD-10-CM

## 2024-01-06 DIAGNOSIS — N184 Chronic kidney disease, stage 4 (severe): Secondary | ICD-10-CM

## 2024-01-07 ENCOUNTER — Encounter: Payer: Self-pay | Admitting: Internal Medicine

## 2024-01-07 NOTE — Progress Notes (Signed)
 [facility] (31)  Provider:   Code Status: DNR Goals of Care:     11/29/2023   10:10 AM  Advanced Directives  Does Patient Have a Medical Advance Directive? Yes  Type of Estate agent of [place];Out of facility DNR (pink MOST or yellow form)  Does patient want to make changes to medical advance directive? No - Patient declined  Copy of Healthcare Power of Attorney in Chart? No - copy requested     Chief Complaint  Patient presents with   Care Management    HPI: Patient is a 89 y.o. female seen today for medical management of chronic diseases.   [place] in [place]   Patient was admitted in the hospital from 10/27 to 10/31 with sepsis due to UTI and acute renal failure Patient has a history of chronic kidney disease, hypertension and chronic anemia.   Acute issues Hypertension Patient's blood pressure runs high but she refuses to take any medication.  She does have a as needed hydralazine for SBP more than 170 she.  She refuses it anyway CKD stage IV Has refused further workup.  She was thinking of hospice  .  But now doing therapy Chronic anemia due to CKD Inability to ambulate without assist Continues to be her issue.  She is working with therapy.  But has severe arthritis in both her knee her neck her hands.  She has to wear a brace to walk.  She is able to walk by herself with walker but needs to provide region [place] get to [place] Cognitively does well Patient's primary care is in [facility]. And she has another physician in [place] that she follows through video visit  Does not have any children.  Her friend is her POA Wt Readings from Last 3 Encounters:  01/07/24 126 lb (57.2 kg)  12/09/23 119 lb 4.8 oz (54.1 kg)  11/29/23 119 lb 4.8 oz (54.1 kg)    No past medical history on file.  No past surgical history on file.  Allergies  Allergen Reactions   Amoxicillin Nausea And  Vomiting   Ivp Dye [Iodinated Contrast Media]     Outpatient Encounter Medications as of 01/06/2024  Medication Sig   acetaminophen (TYLENOL) 325 MG tablet Take 2 tablets (650 mg total) by mouth every 8 (eight) hours as needed (or Fever >/= 101).   diclofenac Sodium (VOLTAREN) 1 % GEL Apply 2 g topically every 8 (eight) hours as needed.   Docusate Sodium (COLACE PO) Take 1 tablet by mouth daily as needed.   hydrALAZINE (APRESOLINE) 10 MG tablet Take 10 mg by mouth daily as needed.   Zinc Oxide 10 % OINT Apply 1 Application topically every 2 (two) hours as needed.   No facility-administered encounter medications on file as of 01/06/2024.    Review of Systems:  Review of Systems  Constitutional:  Negative for activity change and appetite change.  HENT: Negative.    Respiratory:  Negative for cough and shortness of breath.   Cardiovascular:  Negative for leg swelling.  Gastrointestinal:  Negative for constipation.  Genitourinary: Negative.   Musculoskeletal:  Positive for arthralgias, gait problem and myalgias.  Skin: Negative.   Neurological:  Negative for dizziness and weakness.  Psychiatric/Behavioral:  Negative for confusion, dysphoric mood and sleep disturbance.     Health Maintenance  Topic Date Due   Pneumonia Vaccine 24+ Years old (1 of 2 -  PCV) Never done   DTaP/Tdap/Td (1 - Tdap) Never done   Zoster Vaccines- Shingrix (1 of 2) Never done   INFLUENZA VACCINE  Never done   COVID-19 Vaccine (1 - 2024-25 season) Never done   DEXA SCAN  Completed   HPV VACCINES  Aged Out    Physical Exam: Vitals:   01/07/24 1341  BP: (!) 160/80  Pulse: 64  Temp: (!) 97.5 F (36.4 C)  Weight: 126 lb (57.2 kg)   Body mass index is 22.32 kg/m. Physical Exam Vitals reviewed.  Constitutional:      Appearance: Normal appearance.  HENT:     Head: Normocephalic.     Nose: Nose normal.     Mouth/Throat:     Mouth: Mucous membranes are moist.     Pharynx: Oropharynx is clear.  Eyes:      Pupils: Pupils are equal, round, and reactive to light.  Cardiovascular:     Rate and Rhythm: Normal rate and regular rhythm.     Pulses: Normal pulses.     Heart sounds: Normal heart sounds. No murmur heard. Pulmonary:     Effort: Pulmonary effort is normal.     Breath sounds: Normal breath sounds.  Abdominal:     General: Abdomen is flat. Bowel sounds are normal.     Palpations: Abdomen is soft.  Musculoskeletal:        General: Swelling present.     Cervical back: Neck supple.  Skin:    General: Skin is warm.  Neurological:     General: No focal deficit present.     Mental Status: She is alert and oriented to person, place, and time.     Comments: Can walk with the walker with mild assist  Psychiatric:        Mood and Affect: Mood normal.        Thought Content: Thought content normal.     Labs reviewed: Basic Metabolic Panel: Recent Labs    09/05/23 1817 09/05/23 2047 09/06/23 0259 09/06/23 0934 09/07/23 0326 09/08/23 0604 09/09/23 0522 09/16/23 0000 09/27/23 0000 10/28/23 0000  NA  --  132* 134*   < > 137 137 138 138 138 138  K  --  5.3* 5.4*   < > 4.6 4.0 4.4 4.2 4.4 5.3*  CL  --  112* 115*   < > 112* 110 115* 107 108 108  CO2  --  11* 14*   < > 17* 20* 19* 25* 25* 21  GLUCOSE  --  86 102*   < > 144* 106* 102*  --   --   --   BUN  --  110* 85*   < > 96* 89* 81* 54* 44* 72*  CREATININE  --  2.02* 2.06*   < > 2.09* 2.14* 1.96* 1.6* 1.5* 1.9*  CALCIUM  --  8.1* 7.8*   < > 7.6* 7.6* 7.8* 7.9* 8.6* 9.4  MG  --  2.1 2.2  --  2.1  --   --   --   --   --   PHOS  --  4.5 4.4  --  3.1  --   --   --   --   --   TSH 3.856 3.388  --   --   --   --   --   --   --   --    < > = values in this interval not displayed.   Liver Function Tests: Recent Labs  09/05/23 1411 09/06/23 0259 09/07/23 0326  AST 23 20 22   ALT 27 24 25   ALKPHOS 52 49 44  BILITOT 0.2* 0.9 0.7  PROT 5.5* 4.2* 4.1*  ALBUMIN 3.0* 2.0* 1.8*   Recent Labs    09/05/23 1411  LIPASE 69*    Recent Labs    09/05/23 2047  AMMONIA 20   CBC: Recent Labs    09/06/23 0259 09/07/23 0326 09/08/23 0604 09/16/23 0000  WBC 7.2 6.7 4.5 6.2  NEUTROABS  --  5.9 2.6 4,817.00  HGB 8.0* 7.1* 8.3* 9.3*  HCT 23.6* 20.6* 24.3* 28*  MCV 97.9 93.6 94.6  --   PLT 148* 177 182 201   Lipid Panel: No results for input(s): "CHOL", "HDL", "LDLCALC", "TRIG", "CHOLHDL", "LDLDIRECT" in the last 8760 hours. No results found for: "HGBA1C"  Procedures since last visit: No results found.  Assessment/Plan 1. Chronic kidney disease, stage 4 (severe) (HCC) (Primary) Is not interested in any further workup BUN 67 creatinine 2.1 This was her labs a week ago  2. Benign hypertension with chronic kidney disease Blood pressure does run high but she refuses any treatment Does have hydralazine as needed she refuses to take that  3. Primary osteoarthritis involving multiple joints Cannot take NSAIDs I told her that she can increase her Tylenol  4. Unstable gait Is working with therapy Unable to go back to her apartment because she cannot do transfers yet  5. Numbness of right hand s chronic for her and she says her carpal tunnel was ruled out and was told that is coming from her neck   Labs/tests ordered: Continue to monitor kidney function and anemia Next appt:  Visit date not found  Total time spent in this patient care encounter was  45_  minutes; greater than 50% of the visit spent counseling patient and staff, reviewing records , Labs and coordinating care for problems addressed at this encounter.

## 2024-01-21 ENCOUNTER — Encounter: Payer: Self-pay | Admitting: Nurse Practitioner

## 2024-01-21 ENCOUNTER — Non-Acute Institutional Stay (SKILLED_NURSING_FACILITY): Payer: Self-pay | Admitting: Nurse Practitioner

## 2024-01-21 DIAGNOSIS — N1832 Chronic kidney disease, stage 3b: Secondary | ICD-10-CM | POA: Diagnosis not present

## 2024-01-21 DIAGNOSIS — I10 Essential (primary) hypertension: Secondary | ICD-10-CM

## 2024-01-21 DIAGNOSIS — R269 Unspecified abnormalities of gait and mobility: Secondary | ICD-10-CM | POA: Diagnosis not present

## 2024-01-21 DIAGNOSIS — D649 Anemia, unspecified: Secondary | ICD-10-CM

## 2024-01-21 DIAGNOSIS — M159 Polyosteoarthritis, unspecified: Secondary | ICD-10-CM | POA: Insufficient documentation

## 2024-01-21 DIAGNOSIS — M150 Primary generalized (osteo)arthritis: Secondary | ICD-10-CM

## 2024-01-21 NOTE — Progress Notes (Signed)
 Location:   [facility] Room Number: 32 Place of Service:  SNF (31) Provider: Chipper Oman NP  No primary care provider on file.  No care team member to display  Extended Emergency Contact Information Primary Emergency Contact: Knott,[name] Mobile Phone: [phone] Relation: Other Secondary Emergency Contact: Mazeeva,Natalia Mobile Phone: [phone] Relation: Friend  Code Status: DNR Goals of care: Advanced Directive information    11/29/2023   10:10 AM  Advanced Directives  Does Patient Have a Medical Advance Directive? Yes  Type of Estate agent of [place];Out of facility DNR (pink MOST or yellow form)  Does patient want to make changes to medical advance directive? No - Patient declined  Copy of Healthcare Power of Attorney in Chart? No - copy requested     Chief Complaint  Patient presents with   Acute Visit    Medication review.     HPI:  Pt is a 89 y.o. female seen today for an acute visit for medication review followed admission to SNF [facility] for therapy.    HTN, intermittent elevated Sbp, asymptomatic, the patient stated its not new, prn Hydralazine   OA lower back, knees, chronic, takes Tylenol.   Edema, chronic, mild RLE  Gait abnormality, walker, w/c, power w/c  CKD Bun/creat 72/1.9 10/28/23, baseline creat around 2.   Anemia, Hgb 9.3 09/16/23, TSH 3.388 09/05/23, Iron 55, Vit B12 1073 09/05/23  Numbness of the right hand, she was told this is coming from her neck.   History reviewed. No pertinent past medical history. History reviewed. No pertinent surgical history.  Allergies  Allergen Reactions   Amoxicillin Nausea And Vomiting   Ivp Dye [Iodinated Contrast Media]     Allergies as of 01/21/2024       Reactions   Amoxicillin Nausea And Vomiting   Ivp Dye [iodinated Contrast Media]         Medication List        Accurate as of January 21, 2024 12:26 PM. If you have any questions, ask your nurse or doctor.           acetaminophen 325 MG tablet Commonly known as: TYLENOL Take 2 tablets (650 mg total) by mouth every 8 (eight) hours as needed (or Fever >/= 101).   COLACE PO Take 1 tablet by mouth daily as needed.   diclofenac Sodium 1 % Gel Commonly known as: VOLTAREN Apply 2 g topically every 8 (eight) hours as needed.   hydrALAZINE 10 MG tablet Commonly known as: APRESOLINE Take 10 mg by mouth daily as needed.   Zinc Oxide 10 % Oint Apply 1 Application topically every 2 (two) hours as needed.        Review of Systems  Constitutional:  Negative for appetite change, fatigue and fever.  HENT:  Negative for congestion and trouble swallowing.   Eyes:  Negative for visual disturbance.  Respiratory:  Negative for cough and shortness of breath.   Cardiovascular:  Positive for leg swelling. Negative for chest pain and palpitations.  Gastrointestinal:  Negative for abdominal pain.  Genitourinary:  Negative for dysuria and urgency.  Musculoskeletal:  Positive for arthralgias, back pain and gait problem.  Skin:  Negative for color change.  Neurological:  Negative for speech difficulty, weakness and headaches.  Psychiatric/Behavioral:  Negative for confusion and sleep disturbance. The patient is not nervous/anxious.      There is no immunization history on file for this patient. Pertinent  Health Maintenance Due  Topic Date Due  INFLUENZA VACCINE  Never done   DEXA SCAN  Completed      04/20/2023   10:04 AM 09/24/2023   12:12 PM 10/22/2023    3:08 PM  Fall Risk  Falls in the past year? 1 1 1   Was there an injury with Fall? 0 0 0  Fall Risk Category Calculator 2 1 1   Patient at Risk for Falls Due to History of fall(s);Impaired balance/gait History of fall(s);Impaired balance/gait;Impaired mobility History of fall(s);Impaired balance/gait;Impaired mobility  Fall risk Follow up Falls evaluation completed Falls evaluation completed;Education provided Falls evaluation  completed;Education provided   Functional Status Survey:    Vitals:   01/21/24 1210 01/21/24 1211  BP: (!) 172/82 (!) 181/85  Pulse: (!) 19   Resp: 19   Temp: (!) 97.2 F (36.2 C)   SpO2: 96%    There is no height or weight on file to calculate BMI. Physical Exam Vitals and nursing note reviewed.  Constitutional:      Appearance: Normal appearance.  HENT:     Head: Normocephalic and atraumatic.     Nose: Nose normal.     Mouth/Throat:     Mouth: Mucous membranes are moist.  Eyes:     Extraocular Movements: Extraocular movements intact.     Conjunctiva/sclera: Conjunctivae normal.     Pupils: Pupils are equal, round, and reactive to light.  Cardiovascular:     Rate and Rhythm: Normal rate and regular rhythm.     Heart sounds: No murmur heard. Pulmonary:     Effort: Pulmonary effort is normal.     Breath sounds: No rales.  Abdominal:     General: Bowel sounds are normal.     Palpations: Abdomen is soft.     Tenderness: There is no abdominal tenderness.  Musculoskeletal:        General: No tenderness.     Cervical back: Normal range of motion and neck supple.     Right lower leg: Edema present.     Left lower leg: No edema.     Comments: Trace edema RLE  Skin:    General: Skin is warm and dry.  Neurological:     General: No focal deficit present.     Mental Status: She is alert and oriented to person, place, and time. Mental status is at baseline.     Motor: No weakness.     Gait: Gait abnormal.  Psychiatric:        Mood and Affect: Mood normal.        Behavior: Behavior normal.        Thought Content: Thought content normal.        Judgment: Judgment normal.     Labs reviewed: Recent Labs    09/05/23 2047 09/06/23 0259 09/06/23 0934 09/07/23 0326 09/08/23 0604 09/09/23 0522 09/16/23 0000 09/27/23 0000 10/28/23 0000  NA 132* 134*   < > 137 137 138 138 138 138  K 5.3* 5.4*   < > 4.6 4.0 4.4 4.2 4.4 5.3*  CL 112* 115*   < > 112* 110 115* 107 108 108   CO2 11* 14*   < > 17* 20* 19* 25* 25* 21  GLUCOSE 86 102*   < > 144* 106* 102*  --   --   --   BUN 110* 85*   < > 96* 89* 81* 54* 44* 72*  CREATININE 2.02* 2.06*   < > 2.09* 2.14* 1.96* 1.6* 1.5* 1.9*  CALCIUM 8.1* 7.8*   < >  7.6* 7.6* 7.8* 7.9* 8.6* 9.4  MG 2.1 2.2  --  2.1  --   --   --   --   --   PHOS 4.5 4.4  --  3.1  --   --   --   --   --    < > = values in this interval not displayed.   Recent Labs    09/05/23 1411 09/06/23 0259 09/07/23 0326  AST 23 20 22   ALT 27 24 25   ALKPHOS 52 49 44  BILITOT 0.2* 0.9 0.7  PROT 5.5* 4.2* 4.1*  ALBUMIN 3.0* 2.0* 1.8*   Recent Labs    09/06/23 0259 09/07/23 0326 09/08/23 0604 09/16/23 0000  WBC 7.2 6.7 4.5 6.2  NEUTROABS  --  5.9 2.6 4,817.00  HGB 8.0* 7.1* 8.3* 9.3*  HCT 23.6* 20.6* 24.3* 28*  MCV 97.9 93.6 94.6  --   PLT 148* 177 182 201   Lab Results  Component Value Date   TSH 3.388 09/05/2023   No results found for: "HGBA1C" No results found for: "CHOL", "HDL", "LDLCALC", "LDLDIRECT", "TRIG", "CHOLHDL"  Significant Diagnostic Results in last 30 days:  No results found.  Assessment/Plan: HTN (hypertension) intermittent elevated Sbp, asymptomatic, the patient stated its not new, prn Hydralazine   Osteoarthritis, multiple sites OA lower back, knees, chronic, takes Tylenol.   Gait abnormality walker, w/c, power w/c, working with therapy.   Chronic kidney disease, stage 3b (HCC) Bun/creat 72/1.9 10/28/23, baseline creat around 2.    Family/ staff Communication: plan of care reviewed with the patient and charge nurse.   Labs/tests ordered:  none

## 2024-01-21 NOTE — Assessment & Plan Note (Signed)
 Bun/creat 72/1.9 10/28/23, baseline creat around 2.

## 2024-01-21 NOTE — Assessment & Plan Note (Signed)
 Hgb 9.3 09/16/23, TSH 3.388 09/05/23, Iron 55, Vit B12 1073 09/05/23

## 2024-01-21 NOTE — Assessment & Plan Note (Signed)
 OA lower back, knees, chronic, takes Tylenol.

## 2024-01-21 NOTE — Assessment & Plan Note (Signed)
 walker, w/c, power w/c, working with therapy.

## 2024-01-21 NOTE — Assessment & Plan Note (Signed)
 intermittent elevated Sbp, asymptomatic, the patient stated its not new, prn Hydralazine

## 2024-01-27 ENCOUNTER — Non-Acute Institutional Stay (SKILLED_NURSING_FACILITY): Admitting: Sports Medicine

## 2024-01-27 ENCOUNTER — Encounter: Payer: Self-pay | Admitting: Sports Medicine

## 2024-01-27 DIAGNOSIS — M150 Primary generalized (osteo)arthritis: Secondary | ICD-10-CM

## 2024-01-27 DIAGNOSIS — I10 Essential (primary) hypertension: Secondary | ICD-10-CM | POA: Diagnosis not present

## 2024-01-27 DIAGNOSIS — N1832 Chronic kidney disease, stage 3b: Secondary | ICD-10-CM

## 2024-01-27 LAB — LIPID PANEL
Cholesterol: 252 — AB (ref 0–200)
HDL: 133 — AB (ref 35–70)
LDL Cholesterol: 105
LDl/HDL Ratio: 1.9
Triglycerides: 49 (ref 40–160)

## 2024-01-27 LAB — COMPREHENSIVE METABOLIC PANEL WITH GFR
Albumin: 3.9 (ref 3.5–5.0)
Calcium: 9.3 (ref 8.7–10.7)
Globulin: 2.6
eGFR: 26

## 2024-01-27 LAB — CBC AND DIFFERENTIAL
HCT: 30 — AB (ref 36–46)
Hemoglobin: 9.8 — AB (ref 12.0–16.0)
Neutrophils Absolute: 1510
Platelets: 218 10*3/uL (ref 150–400)
WBC: 3.2

## 2024-01-27 LAB — HEMOGLOBIN A1C: Hemoglobin A1C: 5.5

## 2024-01-27 LAB — BASIC METABOLIC PANEL WITH GFR
BUN: 74 — AB (ref 4–21)
CO2: 19 (ref 13–22)
Chloride: 111 — AB (ref 99–108)
Creatinine: 1.8 — AB (ref 0.5–1.1)
Glucose: 76
Potassium: 4.8 meq/L (ref 3.5–5.1)
Sodium: 139 (ref 137–147)

## 2024-01-27 LAB — HEPATIC FUNCTION PANEL
ALT: 19 U/L (ref 7–35)
AST: 22 (ref 13–35)
Alkaline Phosphatase: 88 (ref 25–125)
Bilirubin, Total: 0.3

## 2024-01-27 LAB — TSH: TSH: 2.65 (ref 0.41–5.90)

## 2024-01-27 LAB — CBC: RBC: 2.99 — AB (ref 3.87–5.11)

## 2024-01-27 NOTE — Progress Notes (Signed)
 Provider:  Dr. Venita Sheffield Location:  [facility] Place of Service:   Skilled care [place]  PCP: No primary care provider on file. No care team member to display  Extended Emergency Contact Information Primary Emergency Contact: Knott,[name] Mobile Phone: [phone] Relation: Other Secondary Emergency Contact: Mazeeva,Natalia Mobile Phone: [phone] Relation: Friend  Goals of Care: Advanced Directive information    11/29/2023   10:10 AM  Advanced Directives  Does Patient Have a Medical Advance Directive? Yes  Type of Estate agent of [place];Out of facility DNR (pink MOST or yellow form)  Does patient want to make changes to medical advance directive? No - Patient declined  Copy of Healthcare Power of Attorney in Chart? No - copy requested      No chief complaint on file.      History of Present Illness         89 year old female with a past medical history of hypertension, osteoarthritis, lower extremity swelling, CKD, anemia is seen today for admission to skilled care. Patient seen and examined in her room. She is sitting in the power scooter.  Seems pleasant and comfortable and does not appear to be in distress Patient complains of pain everywhere.  Complains of pain in both her knees and reports that it is very hard for her to transfer from the bed and painful to walk. She is currently working with physical therapy and plan to move back to her independent living apartment. Also complains of neuralgia in the left foot but she is skeptical about taking a lot of medications.  She is currently taking Tylenol 650 twice a day and wondering about Aleve together with Tylenol. As per nursing staff patient refuses to take blood pressure medications.  She still does video visits with her friend/doctor in [place] for her care.      No past medical history on file. No past surgical history on file.  reports that she has never  smoked. She has never used smokeless tobacco. She reports that she does not currently use alcohol. She reports that she does not use drugs. Social History   Socioeconomic History   Marital status: Widowed    Spouse name: Not on file   Number of children: Not on file   Years of education: Not on file   Highest education level: Not on file  Occupational History   Not on file  Tobacco Use   Smoking status: Never   Smokeless tobacco: Never  Substance and Sexual Activity   Alcohol use: Not Currently   Drug use: Never   Sexual activity: Not Currently  Other Topics Concern   Not on file  Social History Narrative   Not on file   Social Drivers of Health   Financial Resource Strain: Low Risk  (09/24/2023)   Overall Financial Resource Strain (CARDIA)    Difficulty of Paying Living Expenses: Not hard at all  Food Insecurity: No Food Insecurity (09/24/2023)   Hunger Vital Sign    Worried About Running Out of Food in the Last Year: Never true    Ran Out of Food in the Last Year: Never true  Transportation Needs: No Transportation Needs (09/24/2023)   PRAPARE - Administrator, Civil Service (Medical): No    Lack of Transportation (Non-Medical): No  Physical Activity: Sufficiently Active (09/24/2023)   Exercise Vital Sign    Days of Exercise per Week: 5 days    Minutes of Exercise per Session: 30 min  Stress: No  Stress Concern Present (09/24/2023)   [employer] of Occupational Health - Occupational Stress Questionnaire    Feeling of Stress : Not at all  Social Connections: Moderately Isolated (09/24/2023)   Social Connection and Isolation Panel [NHANES]    Frequency of Communication with Friends and Family: More than three times a week    Frequency of Social Gatherings with Friends and Family: Three times a week    Attends Religious Services: More than 4 times per year    Active Member of Clubs or Organizations: No    Attends Banker Meetings: Never     Marital Status: Widowed  Intimate Partner Violence: Not At Risk (09/24/2023)   Humiliation, Afraid, Rape, and Kick questionnaire    Fear of Current or Ex-Partner: No    Emotionally Abused: No    Physically Abused: No    Sexually Abused: No    Functional Status Survey:    No family history on file.  Health Maintenance  Topic Date Due   Pneumonia Vaccine 26+ Years old (1 of 2 - PCV) Never done   DTaP/Tdap/Td (1 - Tdap) Never done   Zoster Vaccines- Shingrix (1 of 2) Never done   INFLUENZA VACCINE  Never done   COVID-19 Vaccine (1 - 2024-25 season) Never done   DEXA SCAN  Completed   HPV VACCINES  Aged Out    Allergies  Allergen Reactions   Amoxicillin Nausea And Vomiting   Ivp Dye [Iodinated Contrast Media]     Outpatient Encounter Medications as of 01/27/2024  Medication Sig   acetaminophen (TYLENOL) 325 MG tablet Take 2 tablets (650 mg total) by mouth every 8 (eight) hours as needed (or Fever >/= 101).   diclofenac Sodium (VOLTAREN) 1 % GEL Apply 2 g topically every 8 (eight) hours as needed.   Docusate Sodium (COLACE PO) Take 1 tablet by mouth daily as needed.   hydrALAZINE (APRESOLINE) 10 MG tablet Take 10 mg by mouth daily as needed.   Zinc Oxide 10 % OINT Apply 1 Application topically every 2 (two) hours as needed.   No facility-administered encounter medications on file as of 01/27/2024.    Review of Systems  Constitutional:  Negative for chills and fever.  HENT:  Negative for sinus pressure and sore throat.   Respiratory:  Negative for cough, shortness of breath and wheezing.   Cardiovascular:  Positive for leg swelling. Negative for chest pain and palpitations.  Gastrointestinal:  Negative for abdominal distention, abdominal pain, blood in stool, constipation, diarrhea, nausea and vomiting.  Genitourinary:  Negative for dysuria, frequency and urgency.  Musculoskeletal:  Positive for arthralgias and neck pain.  Psychiatric/Behavioral:  Negative for confusion.      There were no vitals filed for this visit. There is no height or weight on file to calculate BMI. BP Readings from Last 3 Encounters:  01/21/24 (!) 181/85  01/07/24 (!) 160/80  12/09/23 (!) 154/90   Wt Readings from Last 3 Encounters:  01/07/24 126 lb (57.2 kg)  12/09/23 119 lb 4.8 oz (54.1 kg)  11/29/23 119 lb 4.8 oz (54.1 kg)   Physical Exam Constitutional:      Appearance: Normal appearance.  HENT:     Head: Normocephalic and atraumatic.  Cardiovascular:     Rate and Rhythm: Normal rate and regular rhythm.  Pulmonary:     Effort: Pulmonary effort is normal. No respiratory distress.     Breath sounds: Normal breath sounds. No wheezing.  Abdominal:     General: Bowel  sounds are normal. There is no distension.     Tenderness: There is no abdominal tenderness. There is no guarding or rebound.     Comments:    Musculoskeletal:        General: No swelling.     Comments: Minimal lower extremity swelling Pt is able to elevate her Rt only by supporting her Rt knee with her hands   Neurological:     Mental Status: She is alert. Mental status is at baseline.     Labs reviewed: Basic Metabolic Panel: Recent Labs    09/05/23 2047 09/06/23 0259 09/06/23 0934 09/07/23 0326 09/08/23 0604 09/09/23 0522 09/16/23 0000 09/27/23 0000 10/28/23 0000  NA 132* 134*   < > 137 137 138 138 138 138  K 5.3* 5.4*   < > 4.6 4.0 4.4 4.2 4.4 5.3*  CL 112* 115*   < > 112* 110 115* 107 108 108  CO2 11* 14*   < > 17* 20* 19* 25* 25* 21  GLUCOSE 86 102*   < > 144* 106* 102*  --   --   --   BUN 110* 85*   < > 96* 89* 81* 54* 44* 72*  CREATININE 2.02* 2.06*   < > 2.09* 2.14* 1.96* 1.6* 1.5* 1.9*  CALCIUM 8.1* 7.8*   < > 7.6* 7.6* 7.8* 7.9* 8.6* 9.4  MG 2.1 2.2  --  2.1  --   --   --   --   --   PHOS 4.5 4.4  --  3.1  --   --   --   --   --    < > = values in this interval not displayed.   Liver Function Tests: Recent Labs    09/05/23 1411 09/06/23 0259 09/07/23 0326  AST 23 20 22    ALT 27 24 25   ALKPHOS 52 49 44  BILITOT 0.2* 0.9 0.7  PROT 5.5* 4.2* 4.1*  ALBUMIN 3.0* 2.0* 1.8*   Recent Labs    09/05/23 1411  LIPASE 69*   Recent Labs    09/05/23 2047  AMMONIA 20   CBC: Recent Labs    09/06/23 0259 09/07/23 0326 09/08/23 0604 09/16/23 0000  WBC 7.2 6.7 4.5 6.2  NEUTROABS  --  5.9 2.6 4,817.00  HGB 8.0* 7.1* 8.3* 9.3*  HCT 23.6* 20.6* 24.3* 28*  MCV 97.9 93.6 94.6  --   PLT 148* 177 182 201   Cardiac Enzymes: Recent Labs    09/05/23 2047  CKTOTAL 102   BNP: Invalid input(s): "POCBNP" No results found for: "HGBA1C" Lab Results  Component Value Date   TSH 3.388 09/05/2023   Lab Results  Component Value Date   VITAMINB12 1,073 (H) 09/05/2023   Lab Results  Component Value Date   FOLATE 15.8 09/05/2023   Lab Results  Component Value Date   IRON 55 09/05/2023   TIBC 259 09/05/2023   FERRITIN 237 09/05/2023    Imaging and Procedures obtained prior to SNF admission: ECHOCARDIOGRAM COMPLETE Result Date: 09/06/2023    ECHOCARDIOGRAM REPORT   Patient Name:   Elaine Campbell Date of Exam: 09/06/2023 Medical Rec #:  [id number]    Height:       63.0 in Accession #:    [id number]   Weight:       123.9 lb Date of Birth:  [birth year]     BSA:          1.578 m Patient Age:    43 years  BP:           140/42 mmHg Patient Gender: F            HR:           88 bpm. Exam Location:  Inpatient Procedure: 2D Echo, Color Doppler and Cardiac Doppler Indications:    Murmur  History:        Patient has no prior history of Echocardiogram examinations.                 Signs/Symptoms:Murmur.  Sonographer:    Darlys Gales Referring Phys: [id number] ANASTASSIA DOUTOVA IMPRESSIONS  1. Left ventricular ejection fraction, by estimation, is 60 to 65%. The left ventricle has normal function. The left ventricle has no regional wall motion abnormalities. Left ventricular diastolic parameters were normal.  2. Right ventricular systolic function is normal. The right ventricular size  is normal.  3. The mitral valve is normal in structure. No evidence of mitral valve regurgitation. No evidence of mitral stenosis.  4. The aortic valve is normal in structure. Aortic valve regurgitation is not visualized. No aortic stenosis is present.  5. The inferior vena cava is normal in size with greater than 50% respiratory variability, suggesting right atrial pressure of 3 mmHg. FINDINGS  Left Ventricle: Left ventricular ejection fraction, by estimation, is 60 to 65%. The left ventricle has normal function. The left ventricle has no regional wall motion abnormalities. The left ventricular internal cavity size was normal in size. There is  no left ventricular hypertrophy. Left ventricular diastolic parameters were normal. Right Ventricle: The right ventricular size is normal. No increase in right ventricular wall thickness. Right ventricular systolic function is normal. Left Atrium: Left atrial size was normal in size. Right Atrium: Right atrial size was normal in size. Pericardium: There is no evidence of pericardial effusion. Mitral Valve: The mitral valve is normal in structure. No evidence of mitral valve regurgitation. No evidence of mitral valve stenosis. Tricuspid Valve: The tricuspid valve is normal in structure. Tricuspid valve regurgitation is not demonstrated. No evidence of tricuspid stenosis. Aortic Valve: The aortic valve is normal in structure. Aortic valve regurgitation is not visualized. No aortic stenosis is present. Aortic valve mean gradient measures 10.5 mmHg. Aortic valve peak gradient measures 18.9 mmHg. Aortic valve area, by VTI measures 2.09 cm. Pulmonic Valve: The pulmonic valve was normal in structure. Pulmonic valve regurgitation is not visualized. No evidence of pulmonic stenosis. Aorta: The aortic root is normal in size and structure. Venous: The inferior vena cava is normal in size with greater than 50% respiratory variability, suggesting right atrial pressure of 3 mmHg.  IAS/Shunts: No atrial level shunt detected by color flow Doppler.  LEFT VENTRICLE PLAX 2D LVIDd:         4.50 cm   Diastology LVIDs:         2.70 cm   LV e' medial:    4.79 cm/s LV PW:         1.00 cm   LV E/e' medial:  14.4 LV IVS:        0.70 cm   LV e' lateral:   8.49 cm/s LVOT diam:     1.80 cm   LV E/e' lateral: 8.1 LV SV:         76 LV SV Index:   48 LVOT Area:     2.54 cm  RIGHT VENTRICLE RV S prime:     18.80 cm/s TAPSE (M-mode): 2.0 cm LEFT ATRIUM  Index        RIGHT ATRIUM           Index LA Vol (A2C):   33.2 ml 21.04 ml/m  RA Area:     12.50 cm LA Vol (A4C):   32.1 ml 20.35 ml/m  RA Volume:   24.10 ml  15.28 ml/m LA Biplane Vol: 35.4 ml 22.44 ml/m  AORTIC VALVE AV Area (Vmax):    1.83 cm AV Area (Vmean):   1.97 cm AV Area (VTI):     2.09 cm AV Vmax:           217.50 cm/s AV Vmean:          149.500 cm/s AV VTI:            0.363 m AV Peak Grad:      18.9 mmHg AV Mean Grad:      10.5 mmHg LVOT Vmax:         156.00 cm/s LVOT Vmean:        116.000 cm/s LVOT VTI:          0.298 m LVOT/AV VTI ratio: 0.82  AORTA Ao Root diam: 3.30 cm MITRAL VALVE                TRICUSPID VALVE MV Area (PHT): 2.49 cm     TR Peak grad:   24.2 mmHg MV Decel Time: 305 msec     TR Vmax:        246.00 cm/s MV E velocity: 69.00 cm/s MV A velocity: 100.00 cm/s  SHUNTS MV E/A ratio:  0.69         Systemic VTI:  0.30 m                             Systemic Diam: 1.80 cm Aditya Sabharwal Electronically signed by Dorthula Nettles Signature Date/Time: 09/06/2023/1:20:46 PM    Final    DG Chest Port 1 View Result Date: 09/05/2023 CLINICAL DATA:  Shortness of breath and weakness EXAM: PORTABLE CHEST 1 VIEW COMPARISON:  Chest CT 08/30/2029 FINDINGS: The heart size and mediastinal contours are within normal limits. Both lungs are clear. The visualized skeletal structures are unremarkable. IMPRESSION: No active disease. Electronically Signed   By: Gaylyn Rong M.D.   On: 09/05/2023 14:36    Assessment and Plan         Hypertension Blood pressure 132/72 Intermittent high readings reported by staff Staff reports that she refuses to take her medications for blood pressure and wants to speak with her doctor [facility] before she takes any medications.  She has hydralazine as needed but has not taken yet Inform patient that we can start amlodipine 5 mg every day but she wants to check first with her doctor in [place].  Osteoarthritis multiple sites Patient complains of pain in her right shoulder right hip bilateral knees left ankle Will increase Tylenol to 1000 mg 3 times daily Informed patient about starting low-dose gabapentin 100 mg but does not want to start yet Continue with the Biofreeze   CKD Inform patient to avoid NSAIDs due to her creatinine Increase oral hydration avoid nephrotoxic medications. Will check labs cbc, bmp   30 min Total time spent for obtaining history,  performing a medically appropriate examination and evaluation, reviewing the tests,ordering  tests,  documenting clinical information in the electronic or other health record,care coordination (not separately reported)

## 2024-02-03 LAB — CBC AND DIFFERENTIAL
HCT: 28 — AB (ref 36–46)
Hemoglobin: 9.2 — AB (ref 12.0–16.0)
Platelets: 204 10*3/uL (ref 150–400)
WBC: 3.2

## 2024-02-03 LAB — CBC: RBC: 2.85 — AB (ref 3.87–5.11)

## 2024-02-03 LAB — BASIC METABOLIC PANEL WITH GFR
BUN: 70 — AB (ref 4–21)
CO2: 17 (ref 13–22)
Chloride: 115 — AB (ref 99–108)
Creatinine: 2 — AB (ref 0.5–1.1)
Glucose: 69
Potassium: 5.1 meq/L (ref 3.5–5.1)
Sodium: 140 (ref 137–147)

## 2024-02-03 LAB — COMPREHENSIVE METABOLIC PANEL WITH GFR: Calcium: 9 (ref 8.7–10.7)

## 2024-02-15 LAB — BASIC METABOLIC PANEL WITH GFR: Potassium: 5.2 meq/L — AB (ref 3.5–5.1)

## 2024-02-22 ENCOUNTER — Non-Acute Institutional Stay (SKILLED_NURSING_FACILITY): Payer: Self-pay | Admitting: Nurse Practitioner

## 2024-02-22 ENCOUNTER — Encounter: Payer: Self-pay | Admitting: Nurse Practitioner

## 2024-02-22 DIAGNOSIS — D649 Anemia, unspecified: Secondary | ICD-10-CM

## 2024-02-22 DIAGNOSIS — N1832 Chronic kidney disease, stage 3b: Secondary | ICD-10-CM | POA: Diagnosis not present

## 2024-02-22 DIAGNOSIS — M150 Primary generalized (osteo)arthritis: Secondary | ICD-10-CM

## 2024-02-22 DIAGNOSIS — R269 Unspecified abnormalities of gait and mobility: Secondary | ICD-10-CM

## 2024-02-22 DIAGNOSIS — K623 Rectal prolapse: Secondary | ICD-10-CM

## 2024-02-22 DIAGNOSIS — E785 Hyperlipidemia, unspecified: Secondary | ICD-10-CM

## 2024-02-22 DIAGNOSIS — I10 Essential (primary) hypertension: Secondary | ICD-10-CM

## 2024-02-22 NOTE — Assessment & Plan Note (Addendum)
 lower back, knees, R shoulder, R hip, L ankle, chronic, takes Tylenol, declined Gabapentin.   Numbness of the right hand, she was told this is coming from her neck.

## 2024-02-22 NOTE — Assessment & Plan Note (Signed)
 TSH 2.65 01/27/24, Iron 55, Vit B12 1073 09/05/23, Hgb 9.2 02/03/24(baseline 9-10)

## 2024-02-22 NOTE — Assessment & Plan Note (Signed)
 intermittent elevated Sbp, asymptomatic, the patient stated its not new, prn Hydralazine, declined Amlodipine.

## 2024-02-22 NOTE — Assessment & Plan Note (Signed)
 Avoid constipation

## 2024-02-22 NOTE — Assessment & Plan Note (Signed)
 walker, w/c, power w/c

## 2024-02-22 NOTE — Assessment & Plan Note (Signed)
 LDL 105 01/27/24

## 2024-02-22 NOTE — Assessment & Plan Note (Signed)
 baseline creat around 2. Bun/creat 70/2.0 02/03/24

## 2024-02-22 NOTE — Progress Notes (Signed)
 Location:  [facility] Room Number: N057-A Place of Service:  SNF (31) Provider:  Reaghan Kawa X, NP    No primary care provider on file.  No care team member to display  Extended Emergency Contact Information Primary Emergency Contact: Knott,[name] Mobile Phone: [phone] Relation: Other Secondary Emergency Contact: Mazeeva,Natalia Mobile Phone: [phone] Relation: Friend  Code Status:  DNR Goals of care: Advanced Directive information    02/22/2024   10:19 AM  Advanced Directives  Does Patient Have a Medical Advance Directive? Yes  Type of Estate agent of [place];Out of facility DNR (pink MOST or yellow form)  Does patient want to make changes to medical advance directive? No - Patient declined  Copy of Healthcare Power of Attorney in Chart? Yes - validated most recent copy scanned in chart (See row information)     Chief Complaint  Patient presents with   Medical Management of Chronic Issues    Routine Visit. In additional needs to discuss DTAP and Shingrix vaccine    HPI:  Pt is a 89 y.o. female seen today for medical management of chronic diseases.    HTN, intermittent elevated Sbp, asymptomatic, the patient stated its not new, prn Hydralazine , declined Amlodipine.              OA lower back, knees, R shoulder, R hip, L ankle, chronic, takes Tylenol , declined Gabapentin.              Edema, chronic, mild RLE             Gait abnormality, walker, w/c, power w/c             CKD  baseline creat around 2. Bun/creat 70/2.0 02/03/24             Anemia, TSH 2.65 01/27/24, Iron 55, Vit B12 1073 09/05/23, Hgb 9.2 02/03/24(baseline 9-10)             Numbness of the right hand, she was told this is coming from her neck.  HLD LDL 105 01/27/24  Rectal prolapse, avoid constipation.     History reviewed. No pertinent past medical history. History reviewed. No pertinent surgical history.  Allergies  Allergen Reactions    Amoxicillin Nausea And Vomiting   Ivp Dye [Iodinated Contrast Media]     Outpatient Encounter Medications as of 02/22/2024  Medication Sig   acetaminophen  (TYLENOL ) 325 MG tablet Take 2 tablets (650 mg total) by mouth every 8 (eight) hours as needed (or Fever >/= 101).   diclofenac  Sodium (VOLTAREN ) 1 % GEL Apply 2 g topically every 8 (eight) hours as needed.   Docusate Sodium (COLACE PO) Take 1 tablet by mouth daily as needed.   gabapentin (NEURONTIN) 100 MG capsule Take 100 mg by mouth daily.   hydrALAZINE  (APRESOLINE ) 10 MG tablet Take 10 mg by mouth daily as needed.   Menthol, Topical Analgesic, (BIOFREEZE COOL THE PAIN) 4 % GEL Apply 1 Application topically in the morning and at bedtime. Apply to knees &shoulder topically two times a day related to PAIN, UNSPECIFIED  apply to bilateral knees &right shoulder   Zinc Oxide 10 % OINT Apply 1 Application topically every 2 (two) hours as needed.   No facility-administered encounter medications on file as of 02/22/2024.    Review of Systems  Constitutional:  Negative for appetite change, fatigue and fever.  HENT:  Negative for congestion and trouble swallowing.   Eyes:  Negative for visual disturbance.  Respiratory:  Negative  for cough and shortness of breath.   Cardiovascular:  Positive for leg swelling. Negative for chest pain and palpitations.  Gastrointestinal:  Negative for abdominal pain.       Hx of rectal prolapse, avoid constipation, doing pelvic exercise, occasional fecal incontinence.   Genitourinary:  Negative for dysuria and urgency.  Musculoskeletal:  Positive for arthralgias, back pain and gait problem.  Skin:  Negative for color change.  Neurological:  Negative for speech difficulty, weakness and headaches.  Hematological:  Positive for adenopathy.  Psychiatric/Behavioral:  Negative for confusion and sleep disturbance. The patient is not nervous/anxious.      There is no immunization history on file for this  patient. Pertinent  Health Maintenance Due  Topic Date Due   INFLUENZA VACCINE  06/09/2024   DEXA SCAN  Completed      04/20/2023   10:04 AM 09/24/2023   12:12 PM 10/22/2023    3:08 PM  Fall Risk  Falls in the past year? 1 1 1   Was there an injury with Fall? 0 0 0  Fall Risk Category Calculator 2 1 1   Patient at Risk for Falls Due to History of fall(s);Impaired balance/gait History of fall(s);Impaired balance/gait;Impaired mobility History of fall(s);Impaired balance/gait;Impaired mobility  Fall risk Follow up Falls evaluation completed Falls evaluation completed;Education provided Falls evaluation completed;Education provided   Functional Status Survey:    Vitals:   02/22/24 0955 02/22/24 1202  BP: (!) 172/89 138/61  Pulse: (!) 50   Resp: 18   Temp: (!) 97.3 F (36.3 C)   SpO2: 97%   Weight: 119 lb 3.2 oz (54.1 kg)   Height: 5\' 3"  (1.6 m)    Body mass index is 21.12 kg/m. Physical Exam Vitals and nursing note reviewed.  Constitutional:      Appearance: Normal appearance.  HENT:     Head: Normocephalic and atraumatic.     Nose: Nose normal.     Mouth/Throat:     Mouth: Mucous membranes are moist.  Eyes:     Extraocular Movements: Extraocular movements intact.     Conjunctiva/sclera: Conjunctivae normal.     Pupils: Pupils are equal, round, and reactive to light.  Cardiovascular:     Rate and Rhythm: Normal rate and regular rhythm.     Heart sounds: No murmur heard. Pulmonary:     Effort: Pulmonary effort is normal.     Breath sounds: No rales.  Abdominal:     General: Bowel sounds are normal.     Palpations: Abdomen is soft.     Tenderness: There is no abdominal tenderness.  Musculoskeletal:        General: No tenderness.     Cervical back: Normal range of motion and neck supple.     Right lower leg: Edema present.     Left lower leg: No edema.     Comments: Trace edema RLE  Skin:    General: Skin is warm and dry.  Neurological:     General: No focal  deficit present.     Mental Status: She is alert and oriented to person, place, and time. Mental status is at baseline.     Motor: No weakness.     Gait: Gait abnormal.  Psychiatric:        Mood and Affect: Mood normal.        Behavior: Behavior normal.        Thought Content: Thought content normal.        Judgment: Judgment normal.  Labs reviewed: Recent Labs    09/05/23 2047 09/06/23 0259 09/06/23 0934 09/07/23 0326 09/08/23 0604 09/09/23 0522 09/16/23 0000 11/29/23 0000 01/27/24 0000 02/03/24 0000 02/15/24 0000  NA 132* 134*   < > 137 137 138   < > 139 139 140  --   K 5.3* 5.4*   < > 4.6 4.0 4.4   < > 5.0 4.8 5.1 5.2*  CL 112* 115*   < > 112* 110 115*   < > 110* 111* 115*  --   CO2 11* 14*   < > 17* 20* 19*   < > 21 19 17   --   GLUCOSE 86 102*   < > 144* 106* 102*  --   --   --   --   --   BUN 110* 85*   < > 96* 89* 81*   < > 76* 74* 70*  --   CREATININE 2.02* 2.06*   < > 2.09* 2.14* 1.96*   < > 2.2* 1.8* 2.0*  --   CALCIUM 8.1* 7.8*   < > 7.6* 7.6* 7.8*   < > 9.4 9.3 9.0  --   MG 2.1 2.2  --  2.1  --   --   --   --   --   --   --   PHOS 4.5 4.4  --  3.1  --   --   --   --   --   --   --    < > = values in this interval not displayed.   Recent Labs    09/05/23 1411 09/06/23 0259 09/07/23 0326 01/27/24 0000  AST 23 20 22 22   ALT 27 24 25 19   ALKPHOS 52 49 44 88  BILITOT 0.2* 0.9 0.7  --   PROT 5.5* 4.2* 4.1*  --   ALBUMIN 3.0* 2.0* 1.8* 3.9   Recent Labs    09/06/23 0259 09/07/23 0326 09/08/23 0604 09/08/23 0604 09/16/23 0000 11/29/23 0000 01/27/24 0000 02/03/24 0000  WBC 7.2 6.7 4.5   < > 6.2 4.1 3.2 3.2  NEUTROABS  --  5.9 2.6  --  4,817.00  --  1,510.00  --   HGB 8.0* 7.1* 8.3*  --  9.3* 9.8* 9.8* 9.2*  HCT 23.6* 20.6* 24.3*  --  28* 29* 30* 28*  MCV 97.9 93.6 94.6  --   --   --   --   --   PLT 148* 177 182  --  201 221 218 204   < > = values in this interval not displayed.   Lab Results  Component Value Date   TSH 2.65 01/27/2024    Lab Results  Component Value Date   HGBA1C 5.5 01/27/2024   Lab Results  Component Value Date   CHOL 252 (A) 01/27/2024   HDL 133 (A) 01/27/2024   LDLCALC 105 01/27/2024   TRIG 49 01/27/2024    Significant Diagnostic Results in last 30 days:  No results found.  Assessment/Plan HLD (hyperlipidemia) LDL 105 01/27/24  Osteoarthritis, multiple sites  lower back, knees, R shoulder, R hip, L ankle, chronic, takes Tylenol , declined Gabapentin.   Numbness of the right hand, she was told this is coming from her neck.  Anemia TSH 2.65 01/27/24, Iron 55, Vit B12 1073 09/05/23, Hgb 9.2 02/03/24(baseline 9-10)  Chronic kidney disease, stage 3b (HCC)  baseline creat around 2. Bun/creat 70/2.0 02/03/24  Gait abnormality walker, w/c, power w/c  HTN (hypertension)  intermittent  elevated Sbp, asymptomatic, the patient stated its not new, prn Hydralazine , declined Amlodipine.   Rectal prolapse Avoid constipation     Family/ staff Communication: plan of care reviewed with the patient and charge nurse.   Labs/tests ordered:  none

## 2024-02-28 ENCOUNTER — Encounter: Payer: Self-pay | Admitting: Nurse Practitioner

## 2024-03-03 ENCOUNTER — Encounter: Payer: Self-pay | Admitting: Nurse Practitioner

## 2024-03-03 ENCOUNTER — Non-Acute Institutional Stay (SKILLED_NURSING_FACILITY): Payer: Self-pay | Admitting: Nurse Practitioner

## 2024-03-03 DIAGNOSIS — R600 Localized edema: Secondary | ICD-10-CM | POA: Diagnosis not present

## 2024-03-03 DIAGNOSIS — M150 Primary generalized (osteo)arthritis: Secondary | ICD-10-CM | POA: Diagnosis not present

## 2024-03-03 DIAGNOSIS — K219 Gastro-esophageal reflux disease without esophagitis: Secondary | ICD-10-CM

## 2024-03-03 DIAGNOSIS — D649 Anemia, unspecified: Secondary | ICD-10-CM | POA: Diagnosis not present

## 2024-03-03 DIAGNOSIS — I10 Essential (primary) hypertension: Secondary | ICD-10-CM

## 2024-03-03 DIAGNOSIS — N1832 Chronic kidney disease, stage 3b: Secondary | ICD-10-CM

## 2024-03-03 NOTE — Assessment & Plan Note (Signed)
 The patient admitted occasionally nauseated and feeling heartburns, no vomiting.  Hx of GERD, not taking acid reducer presently.

## 2024-03-03 NOTE — Assessment & Plan Note (Signed)
 intermittent elevated Sbp, asymptomatic, the patient stated its not new, prn Hydralazine, declined Amlodipine.

## 2024-03-03 NOTE — Assessment & Plan Note (Signed)
 baseline creat around 2. Bun/creat 70/2.38 03/02/24

## 2024-03-03 NOTE — Assessment & Plan Note (Addendum)
 dropping Hgb 8.6 03/02/24<<9.2 02/03/24, denied abd pain, indigestion, or blood in stool or urine. The patient admitted occasionally nauseated and feeling heartburns, no vomiting.  Will repeat CBC/diff, obtain Fe, Ferritin, Vit B12, Folate.  TSH 2.65 01/27/24, Iron 55, Vit B12 1073 09/05/23, Hgb 9.2 02/03/24(baseline 9-10) 03/02/24 wbc 4.3, Hgb 8.6, plt 195, neutrophils 64.2, ferritin 442, Iron 115, Vit B12 659, folate 9.1

## 2024-03-03 NOTE — Assessment & Plan Note (Signed)
 chronic, mild RLE

## 2024-03-03 NOTE — Assessment & Plan Note (Signed)
 OA lower back, knees, R shoulder, R hip, L ankle, chronic, takes Tylenol , declined Gabapentin.

## 2024-03-03 NOTE — Progress Notes (Signed)
 Location:   [facility] Room Number: 69 Place of Service:  SNF (31) Provider: Kerman Peck NP  No primary care provider on file.  No care team member to display  Extended Emergency Contact Information Primary Emergency Contact: Knott,[name] Mobile Phone: [phone] Relation: Other Secondary Emergency Contact: Mazeeva,Natalia Mobile Phone: [phone] Relation: Friend  Code Status: DNR Goals of care: Advanced Directive information    02/22/2024   10:19 AM  Advanced Directives  Does Patient Have a Medical Advance Directive? Yes  Type of Estate agent of [place];Out of facility DNR (pink MOST or yellow form)  Does patient want to make changes to medical advance directive? No - Patient declined  Copy of Healthcare Power of Attorney in Chart? Yes - validated most recent copy scanned in chart (See row information)     Chief Complaint  Patient presents with   Acute Visit    Dropping hemoglobin     HPI:  Pt is a 89 y.o. female seen today for an acute visit for dropping Hgb 8.6 03/02/24<<9.2 02/03/24, denied abd pain, indigestion, or blood in stool or urine. The patient admitted occasionally nauseated and feeling heartburns, no vomiting.   Hx of GERD, not taking acid reducer presently.    HTN, intermittent elevated Sbp, asymptomatic, the patient stated its not new, prn Hydralazine , declined Amlodipine.              OA lower back, knees, R shoulder, R hip, L ankle, chronic, takes Tylenol , declined Gabapentin.              Edema, chronic, mild RLE             Gait abnormality, walker, w/c, power w/c             CKD  baseline creat around 2. Bun/creat 70/2.38 03/02/24             Anemia, TSH 2.65 01/27/24, Iron 55, Vit B12 1073 09/05/23, Hgb 9.2 02/03/24(baseline 9-10)             Numbness of the right hand, she was told this is coming from her neck.             HLD LDL 105 01/27/24             Rectal prolapse, avoid constipation.      History  reviewed. No pertinent past medical history. History reviewed. No pertinent surgical history.  Allergies  Allergen Reactions   Amoxicillin Nausea And Vomiting   Ivp Dye [Iodinated Contrast Media]     Allergies as of 03/03/2024       Reactions   Amoxicillin Nausea And Vomiting   Ivp Dye [iodinated Contrast Media]         Medication List        Accurate as of March 03, 2024 11:59 PM. If you have any questions, ask your nurse or doctor.          acetaminophen  325 MG tablet Commonly known as: TYLENOL  Take 2 tablets (650 mg total) by mouth every 8 (eight) hours as needed (or Fever >/= 101).   Biofreeze Cool The Pain 4 % Gel Generic drug: Menthol (Topical Analgesic) Apply 1 Application topically in the morning and at bedtime. Apply to knees &shoulder topically two times a day related to PAIN, UNSPECIFIED  apply to bilateral knees &right shoulder   COLACE PO Take 1 tablet by mouth daily as needed.   diclofenac  Sodium 1 % Gel Commonly known as:  VOLTAREN  Apply 2 g topically every 8 (eight) hours as needed.   gabapentin 100 MG capsule Commonly known as: NEURONTIN Take 100 mg by mouth daily.   hydrALAZINE  10 MG tablet Commonly known as: APRESOLINE  Take 10 mg by mouth daily as needed.   Zinc Oxide 10 % Oint Apply 1 Application topically every 2 (two) hours as needed.        Review of Systems  Constitutional:  Negative for appetite change, fatigue and fever.  HENT:  Negative for congestion and trouble swallowing.   Eyes:  Negative for visual disturbance.  Respiratory:  Negative for cough and shortness of breath.   Cardiovascular:  Positive for leg swelling. Negative for chest pain and palpitations.  Gastrointestinal:  Negative for abdominal pain.       Hx of rectal prolapse, avoid constipation, doing pelvic exercise, occasional fecal incontinence.  Occasionally nauseated and heartburns.   Genitourinary:  Negative for dysuria and urgency.  Musculoskeletal:   Positive for arthralgias, back pain and gait problem.  Skin:  Negative for color change.  Neurological:  Negative for speech difficulty, weakness and headaches.  Hematological:  Positive for adenopathy.  Psychiatric/Behavioral:  Negative for confusion and sleep disturbance. The patient is not nervous/anxious.      There is no immunization history on file for this patient. Pertinent  Health Maintenance Due  Topic Date Due   INFLUENZA VACCINE  06/09/2024   DEXA SCAN  Completed      04/20/2023   10:04 AM 09/24/2023   12:12 PM 10/22/2023    3:08 PM  Fall Risk  Falls in the past year? 1 1 1   Was there an injury with Fall? 0 0 0  Fall Risk Category Calculator 2 1 1   Patient at Risk for Falls Due to History of fall(s);Impaired balance/gait History of fall(s);Impaired balance/gait;Impaired mobility History of fall(s);Impaired balance/gait;Impaired mobility  Fall risk Follow up Falls evaluation completed Falls evaluation completed;Education provided Falls evaluation completed;Education provided   Functional Status Survey:    Vitals:   03/03/24 1332  BP: (!) 142/73  Pulse: 60  Resp: 18  Temp: (!) 97.3 F (36.3 C)  SpO2: 97%  Weight: 119 lb 3.2 oz (54.1 kg)   Body mass index is 21.12 kg/m. Physical Exam Vitals and nursing note reviewed.  Constitutional:      Appearance: Normal appearance.  HENT:     Head: Normocephalic and atraumatic.     Nose: Nose normal.     Mouth/Throat:     Mouth: Mucous membranes are moist.  Eyes:     Extraocular Movements: Extraocular movements intact.     Conjunctiva/sclera: Conjunctivae normal.     Pupils: Pupils are equal, round, and reactive to light.  Cardiovascular:     Rate and Rhythm: Normal rate and regular rhythm.     Heart sounds: No murmur heard. Pulmonary:     Effort: Pulmonary effort is normal.     Breath sounds: No rales.  Abdominal:     General: Bowel sounds are normal.     Palpations: Abdomen is soft.     Tenderness: There is  no abdominal tenderness.  Musculoskeletal:        General: No tenderness.     Cervical back: Normal range of motion and neck supple.     Right lower leg: Edema present.     Left lower leg: No edema.     Comments: Trace edema RLE  Skin:    General: Skin is warm and dry.  Neurological:  General: No focal deficit present.     Mental Status: She is alert and oriented to person, place, and time. Mental status is at baseline.     Motor: No weakness.     Gait: Gait abnormal.  Psychiatric:        Mood and Affect: Mood normal.        Behavior: Behavior normal.        Thought Content: Thought content normal.        Judgment: Judgment normal.     Labs reviewed: Recent Labs    09/05/23 2047 09/06/23 0259 09/06/23 0934 09/07/23 0326 09/08/23 0604 09/09/23 0522 09/16/23 0000 11/29/23 0000 01/27/24 0000 02/03/24 0000 02/15/24 0000  NA 132* 134*   < > 137 137 138   < > 139 139 140  --   K 5.3* 5.4*   < > 4.6 4.0 4.4   < > 5.0 4.8 5.1 5.2*  CL 112* 115*   < > 112* 110 115*   < > 110* 111* 115*  --   CO2 11* 14*   < > 17* 20* 19*   < > 21 19 17   --   GLUCOSE 86 102*   < > 144* 106* 102*  --   --   --   --   --   BUN 110* 85*   < > 96* 89* 81*   < > 76* 74* 70*  --   CREATININE 2.02* 2.06*   < > 2.09* 2.14* 1.96*   < > 2.2* 1.8* 2.0*  --   CALCIUM 8.1* 7.8*   < > 7.6* 7.6* 7.8*   < > 9.4 9.3 9.0  --   MG 2.1 2.2  --  2.1  --   --   --   --   --   --   --   PHOS 4.5 4.4  --  3.1  --   --   --   --   --   --   --    < > = values in this interval not displayed.   Recent Labs    09/05/23 1411 09/06/23 0259 09/07/23 0326 01/27/24 0000  AST 23 20 22 22   ALT 27 24 25 19   ALKPHOS 52 49 44 88  BILITOT 0.2* 0.9 0.7  --   PROT 5.5* 4.2* 4.1*  --   ALBUMIN 3.0* 2.0* 1.8* 3.9   Recent Labs    09/06/23 0259 09/07/23 0326 09/08/23 0604 09/08/23 0604 09/16/23 0000 11/29/23 0000 01/27/24 0000 02/03/24 0000  WBC 7.2 6.7 4.5   < > 6.2 4.1 3.2 3.2  NEUTROABS  --  5.9 2.6  --   4,817.00  --  1,510.00  --   HGB 8.0* 7.1* 8.3*  --  9.3* 9.8* 9.8* 9.2*  HCT 23.6* 20.6* 24.3*  --  28* 29* 30* 28*  MCV 97.9 93.6 94.6  --   --   --   --   --   PLT 148* 177 182  --  201 221 218 204   < > = values in this interval not displayed.   Lab Results  Component Value Date   TSH 2.65 01/27/2024   Lab Results  Component Value Date   HGBA1C 5.5 01/27/2024   Lab Results  Component Value Date   CHOL 252 (A) 01/27/2024   HDL 133 (A) 01/27/2024   LDLCALC 105 01/27/2024   TRIG 49 01/27/2024    Significant Diagnostic Results in last  30 days:  No results found.  Assessment/Plan: Anemia dropping Hgb 8.6 03/02/24<<9.2 02/03/24, denied abd pain, indigestion, or blood in stool or urine. The patient admitted occasionally nauseated and feeling heartburns, no vomiting.  Will repeat CBC/diff, obtain Fe, Ferritin, Vit B12, Folate.  TSH 2.65 01/27/24, Iron 55, Vit B12 1073 09/05/23, Hgb 9.2 02/03/24(baseline 9-10) 03/02/24 wbc 4.3, Hgb 8.6, plt 195, neutrophils 64.2, ferritin 442, Iron 115, Vit B12 659, folate 9.1  HTN (hypertension)  intermittent elevated Sbp, asymptomatic, the patient stated its not new, prn Hydralazine , declined Amlodipine.   Osteoarthritis, multiple sites OA lower back, knees, R shoulder, R hip, L ankle, chronic, takes Tylenol , declined Gabapentin.   Peripheral edema chronic, mild RLE  Chronic kidney disease, stage 3b (HCC)  baseline creat around 2. Bun/creat 70/2.38 03/02/24  GERD (gastroesophageal reflux disease) The patient admitted occasionally nauseated and feeling heartburns, no vomiting.  Hx of GERD, not taking acid reducer presently.     Family/ staff Communication: plan of care reviewed with the patient and charge nurse.   Labs/tests ordered:  CBC/diff, Fe, Vit B12, Folate, Ferritin

## 2024-03-28 ENCOUNTER — Non-Acute Institutional Stay (SKILLED_NURSING_FACILITY): Payer: Self-pay | Admitting: Nurse Practitioner

## 2024-03-28 ENCOUNTER — Encounter: Payer: Self-pay | Admitting: Nurse Practitioner

## 2024-03-28 DIAGNOSIS — N184 Chronic kidney disease, stage 4 (severe): Secondary | ICD-10-CM | POA: Diagnosis not present

## 2024-03-28 DIAGNOSIS — I10 Essential (primary) hypertension: Secondary | ICD-10-CM

## 2024-03-28 DIAGNOSIS — M150 Primary generalized (osteo)arthritis: Secondary | ICD-10-CM | POA: Diagnosis not present

## 2024-03-28 DIAGNOSIS — D631 Anemia in chronic kidney disease: Secondary | ICD-10-CM

## 2024-03-28 DIAGNOSIS — E785 Hyperlipidemia, unspecified: Secondary | ICD-10-CM | POA: Diagnosis not present

## 2024-03-28 DIAGNOSIS — R600 Localized edema: Secondary | ICD-10-CM

## 2024-03-28 NOTE — Progress Notes (Signed)
 Location:   [facility] Room Number: 62 Place of Service:  SNF (31) Provider: Kerman Peck NP  No primary care provider on file.  No care team member to display  Extended Emergency Contact Information Primary Emergency Contact: Knott,[name] Mobile Phone: [phone] Relation: Other Secondary Emergency Contact: Mazeeva,Natalia Mobile Phone: [phone] Relation: Friend  Code Status:  DNR Goals of care: Advanced Directive information    02/22/2024   10:19 AM  Advanced Directives  Does Patient Have a Medical Advance Directive? Yes  Type of Estate agent of [place];Out of facility DNR (pink MOST or yellow form)  Does patient want to make changes to medical advance directive? No - Patient declined  Copy of Healthcare Power of Attorney in Chart? Yes - validated most recent copy scanned in chart (See row information)     Chief Complaint  Patient presents with   Medical Management of Chronic Issues    HPI:  Pt is a 89 y.o. female seen today for medical management of chronic diseases.                 Hx of GERD, not taking acid reducer presently.               HTN, intermittent elevated Sbp, asymptomatic, the patient stated its not new, hx of prn Hydralazine , declined Amlodipine.              OA lower back, knees, R shoulder, R hip, L ankle, chronic, takes Tylenol , declined Gabapentin.              Edema, chronic, mild RLE             Gait abnormality, walker, w/c, power w/c             CKD  baseline creat around 2. Bun/creat 70/2.38 03/02/24             Anemia, TSH 2.65 01/27/24, Iron 55, Vit B12 1073 09/05/23, Hgb 9.2 02/03/24(baseline 9-10), Hgb 8.6, Iron 115, Vit B12 659, Folate 9.1 03/02/24             Numbness of the right hand, she was told this is coming from her neck.             HLD LDL 105 01/27/24, diet modification.              Rectal prolapse, avoid constipation.       History reviewed. No pertinent past medical history. History  reviewed. No pertinent surgical history.  Allergies  Allergen Reactions   Amoxicillin Nausea And Vomiting   Ivp Dye [Iodinated Contrast Media]     Allergies as of 03/28/2024       Reactions   Amoxicillin Nausea And Vomiting   Ivp Dye [iodinated Contrast Media]         Medication List        Accurate as of Mar 28, 2024 11:59 PM. If you have any questions, ask your nurse or doctor.          acetaminophen  325 MG tablet Commonly known as: TYLENOL  Take 2 tablets (650 mg total) by mouth every 8 (eight) hours as needed (or Fever >/= 101).   Biofreeze Cool The Pain 4 % Gel Generic drug: Menthol (Topical Analgesic) Apply 1 Application topically in the morning and at bedtime. Apply to knees &shoulder topically two times a day related to PAIN, UNSPECIFIED  apply to bilateral knees &right shoulder   COLACE PO Take 1 tablet  by mouth daily as needed.   diclofenac  Sodium 1 % Gel Commonly known as: VOLTAREN  Apply 2 g topically every 8 (eight) hours as needed.   gabapentin 100 MG capsule Commonly known as: NEURONTIN Take 100 mg by mouth daily.   hydrALAZINE  10 MG tablet Commonly known as: APRESOLINE  Take 10 mg by mouth daily as needed.   Zinc Oxide 10 % Oint Apply 1 Application topically every 2 (two) hours as needed.        Review of Systems  Constitutional:  Negative for appetite change, fatigue and fever.  HENT:  Negative for congestion and trouble swallowing.   Eyes:  Negative for visual disturbance.  Respiratory:  Negative for cough and shortness of breath.   Cardiovascular:  Positive for leg swelling. Negative for chest pain and palpitations.  Gastrointestinal:  Negative for abdominal pain.       Hx of rectal prolapse, avoid constipation, doing pelvic exercise, occasional fecal incontinence.  Occasionally nauseated and heartburns.   Genitourinary:  Negative for dysuria and urgency.  Musculoskeletal:  Positive for arthralgias, back pain and gait problem.  Skin:   Negative for color change.  Neurological:  Negative for speech difficulty, weakness and headaches.  Hematological:  Positive for adenopathy.  Psychiatric/Behavioral:  Negative for confusion and sleep disturbance. The patient is not nervous/anxious.      There is no immunization history on file for this patient. Pertinent  Health Maintenance Due  Topic Date Due   INFLUENZA VACCINE  06/09/2024   DEXA SCAN  Completed      04/20/2023   10:04 AM 09/24/2023   12:12 PM 10/22/2023    3:08 PM  Fall Risk  Falls in the past year? 1 1 1   Was there an injury with Fall? 0 0 0  Fall Risk Category Calculator 2 1 1   Patient at Risk for Falls Due to History of fall(s);Impaired balance/gait History of fall(s);Impaired balance/gait;Impaired mobility History of fall(s);Impaired balance/gait;Impaired mobility  Fall risk Follow up Falls evaluation completed Falls evaluation completed;Education provided Falls evaluation completed;Education provided   Functional Status Survey:    Vitals:   03/28/24 1211 03/31/24 1522  BP: (!) 145/65 (!) 147/72  Pulse: (!) 53 (!) 58  Resp: 18   Temp: (!) 97.3 F (36.3 C)   SpO2: 97%   Weight: 114 lb 11.2 oz (52 kg)    Body mass index is 20.32 kg/m. Physical Exam Vitals and nursing note reviewed.  Constitutional:      Appearance: Normal appearance.  HENT:     Head: Normocephalic and atraumatic.     Nose: Nose normal.     Mouth/Throat:     Mouth: Mucous membranes are moist.  Eyes:     Extraocular Movements: Extraocular movements intact.     Conjunctiva/sclera: Conjunctivae normal.     Pupils: Pupils are equal, round, and reactive to light.  Cardiovascular:     Rate and Rhythm: Normal rate and regular rhythm.     Heart sounds: No murmur heard. Pulmonary:     Effort: Pulmonary effort is normal.     Breath sounds: No rales.  Abdominal:     General: Bowel sounds are normal.     Palpations: Abdomen is soft.     Tenderness: There is no abdominal  tenderness.  Musculoskeletal:        General: No tenderness.     Cervical back: Normal range of motion and neck supple.     Right lower leg: Edema present.     Left  lower leg: No edema.     Comments: Trace edema RLE  Skin:    General: Skin is warm and dry.  Neurological:     General: No focal deficit present.     Mental Status: She is alert and oriented to person, place, and time. Mental status is at baseline.     Motor: No weakness.     Gait: Gait abnormal.  Psychiatric:        Mood and Affect: Mood normal.        Behavior: Behavior normal.        Thought Content: Thought content normal.        Judgment: Judgment normal.     Labs reviewed: Recent Labs    09/05/23 2047 09/06/23 0259 09/06/23 0934 09/07/23 0326 09/08/23 0604 09/09/23 0522 09/16/23 0000 11/29/23 0000 01/27/24 0000 02/03/24 0000 02/15/24 0000  NA 132* 134*   < > 137 137 138   < > 139 139 140  --   K 5.3* 5.4*   < > 4.6 4.0 4.4   < > 5.0 4.8 5.1 5.2*  CL 112* 115*   < > 112* 110 115*   < > 110* 111* 115*  --   CO2 11* 14*   < > 17* 20* 19*   < > 21 19 17   --   GLUCOSE 86 102*   < > 144* 106* 102*  --   --   --   --   --   BUN 110* 85*   < > 96* 89* 81*   < > 76* 74* 70*  --   CREATININE 2.02* 2.06*   < > 2.09* 2.14* 1.96*   < > 2.2* 1.8* 2.0*  --   CALCIUM 8.1* 7.8*   < > 7.6* 7.6* 7.8*   < > 9.4 9.3 9.0  --   MG 2.1 2.2  --  2.1  --   --   --   --   --   --   --   PHOS 4.5 4.4  --  3.1  --   --   --   --   --   --   --    < > = values in this interval not displayed.   Recent Labs    09/05/23 1411 09/06/23 0259 09/07/23 0326 01/27/24 0000  AST 23 20 22 22   ALT 27 24 25 19   ALKPHOS 52 49 44 88  BILITOT 0.2* 0.9 0.7  --   PROT 5.5* 4.2* 4.1*  --   ALBUMIN 3.0* 2.0* 1.8* 3.9   Recent Labs    09/06/23 0259 09/07/23 0326 09/08/23 0604 09/08/23 0604 09/16/23 0000 11/29/23 0000 01/27/24 0000 02/03/24 0000  WBC 7.2 6.7 4.5   < > 6.2 4.1 3.2 3.2  NEUTROABS  --  5.9 2.6  --  4,817.00  --   1,510.00  --   HGB 8.0* 7.1* 8.3*  --  9.3* 9.8* 9.8* 9.2*  HCT 23.6* 20.6* 24.3*  --  28* 29* 30* 28*  MCV 97.9 93.6 94.6  --   --   --   --   --   PLT 148* 177 182  --  201 221 218 204   < > = values in this interval not displayed.   Lab Results  Component Value Date   TSH 2.65 01/27/2024   Lab Results  Component Value Date   HGBA1C 5.5 01/27/2024   Lab Results  Component Value Date  CHOL 252 (A) 01/27/2024   HDL 133 (A) 01/27/2024   LDLCALC 105 01/27/2024   TRIG 49 01/27/2024    Significant Diagnostic Results in last 30 days:  No results found.  Assessment/Plan  Chronic kidney disease, stage 4 (severe) (HCC) baseline creat around 2. Bun/creat 70/2.38 03/02/24  Anemia TSH 2.65 01/27/24, Iron 55, Vit B12 1073 09/05/23, Hgb 9.2 02/03/24(baseline 9-10), Hgb 8.6, Iron 115, Vit B12 659, Folate 9.1 03/02/24  HLD (hyperlipidemia) LDL 105 01/27/24, diet modification   Peripheral edema chronic, mild RLE  Osteoarthritis, multiple sites  OA lower back, knees, R shoulder, R hip, L ankle, chronic, takes Tylenol , declined Gabapentin.   HTN (hypertension)  intermittent elevated Sbp, asymptomatic, the patient stated its not new, hx of prn Hydralazine , declined Amlodipine.   GERD (gastroesophageal reflux disease) Stable,  not taking acid reducer presently.    Family/ staff Communication: plan of care reviewed with the patient and charge nurse.   Labs/tests ordered:  BMP, CBC/diff.

## 2024-03-28 NOTE — Assessment & Plan Note (Signed)
 baseline creat around 2. Bun/creat 70/2.38 03/02/24

## 2024-03-28 NOTE — Assessment & Plan Note (Signed)
 Stable,  not taking acid reducer presently.

## 2024-03-28 NOTE — Assessment & Plan Note (Signed)
 TSH 2.65 01/27/24, Iron 55, Vit B12 1073 09/05/23, Hgb 9.2 02/03/24(baseline 9-10), Hgb 8.6, Iron 115, Vit B12 659, Folate 9.1 03/02/24

## 2024-03-28 NOTE — Assessment & Plan Note (Signed)
 LDL 105 01/27/24, diet modification

## 2024-03-28 NOTE — Assessment & Plan Note (Signed)
 OA lower back, knees, R shoulder, R hip, L ankle, chronic, takes Tylenol , declined Gabapentin.

## 2024-03-28 NOTE — Assessment & Plan Note (Signed)
 chronic, mild RLE

## 2024-03-28 NOTE — Assessment & Plan Note (Signed)
 intermittent elevated Sbp, asymptomatic, the patient stated its not new, hx of prn Hydralazine , declined Amlodipine.

## 2024-04-28 ENCOUNTER — Non-Acute Institutional Stay (SKILLED_NURSING_FACILITY): Payer: Self-pay | Admitting: Sports Medicine

## 2024-04-28 ENCOUNTER — Encounter: Payer: Self-pay | Admitting: Sports Medicine

## 2024-04-28 DIAGNOSIS — M159 Polyosteoarthritis, unspecified: Secondary | ICD-10-CM

## 2024-04-28 DIAGNOSIS — I10 Essential (primary) hypertension: Secondary | ICD-10-CM

## 2024-04-28 DIAGNOSIS — N184 Chronic kidney disease, stage 4 (severe): Secondary | ICD-10-CM

## 2024-04-28 DIAGNOSIS — K623 Rectal prolapse: Secondary | ICD-10-CM | POA: Diagnosis not present

## 2024-04-28 DIAGNOSIS — D649 Anemia, unspecified: Secondary | ICD-10-CM | POA: Diagnosis not present

## 2024-04-28 NOTE — Progress Notes (Unsigned)
 Location: [facility] Room Number: 57-A Place of Service:  SNF (31) Provider: Jonathon Neighbors MD  No primary care provider on file.  No care team member to display  Extended Emergency Contact Information Primary Emergency Contact: Knott,[name] Mobile Phone: [phone] Relation: Other Secondary Emergency Contact: Mazeeva,Natalia Mobile Phone: [phone] Relation: Friend  Code Status:  DNR Goals of care: Advanced Directive information    04/28/2024    2:31 PM  Advanced Directives  Does Patient Have a Medical Advance Directive? Yes  Type of Estate agent of [place];Out of facility DNR (pink MOST or yellow form)  Does patient want to make changes to medical advance directive? No - Patient declined  Copy of Healthcare Power of Attorney in Chart? Yes - validated most recent copy scanned in chart (See row information)     Chief Complaint  Patient presents with   Medical Management of Chronic Issues    Routine Visit     HPI:  Pt is a 89 y.o. female seen today for medical management of chronic diseases.     History reviewed. No pertinent past medical history. History reviewed. No pertinent surgical history.  Allergies  Allergen Reactions   Amoxicillin Nausea And Vomiting   Ivp Dye [Iodinated Contrast Media]     Allergies as of 04/28/2024       Reactions   Amoxicillin Nausea And Vomiting   Ivp Dye [iodinated Contrast Media]         Medication List        Accurate as of April 28, 2024  2:41 PM. If you have any questions, ask your nurse or doctor.          acetaminophen  500 MG tablet Commonly known as: TYLENOL  Take 1,000 mg by mouth every 6 (six) hours as needed for mild pain (pain score 1-3).   acetaminophen  325 MG tablet Commonly known as: TYLENOL  Take 2 tablets (650 mg total) by mouth every 8 (eight) hours as needed (or Fever >/= 101).   Biofreeze Cool The Pain 4 % Gel Generic drug: Menthol  (Topical Analgesic) Apply 1 Application topically in the morning and at bedtime. Apply to knees &shoulder topically two times a day related to PAIN, UNSPECIFIED  apply to bilateral knees &right shoulder   COLACE PO Take 1 tablet by mouth daily as needed.   diclofenac  Sodium 1 % Gel Commonly known as: VOLTAREN  Apply 2 g topically every 8 (eight) hours as needed.   gabapentin 100 MG capsule Commonly known as: NEURONTIN Take 100 mg by mouth daily.   hydrALAZINE  10 MG tablet Commonly known as: APRESOLINE  Take 10 mg by mouth daily as needed.   Zinc Oxide 10 % Oint Apply 1 Application topically every 2 (two) hours as needed.        Review of Systems   There is no immunization history on file for this patient. Pertinent  Health Maintenance Due  Topic Date Due   INFLUENZA VACCINE  06/09/2024   DEXA SCAN  Completed      04/20/2023   10:04 AM 09/24/2023   12:12 PM 10/22/2023    3:08 PM  Fall Risk  Falls in the past year? 1 1 1   Was there an injury with Fall? 0 0 0  Fall Risk Category Calculator 2 1 1   Patient at Risk for Falls Due to History of fall(s);Impaired balance/gait History of fall(s);Impaired balance/gait;Impaired mobility History of fall(s);Impaired balance/gait;Impaired mobility  Fall risk Follow up Falls evaluation completed Falls evaluation completed;Education  provided Falls evaluation completed;Education provided   Functional Status Survey:    Vitals:   04/28/24 1439  BP: (!) 154/64  Pulse: (!) 57  Temp: (!) 97.3 F (36.3 C)  SpO2: 97%  Weight: 123 lb 3.2 oz (55.9 kg)  Height: 5' 3 (1.6 m)   Body mass index is 21.82 kg/m. Physical Exam  Labs reviewed: Recent Labs    09/05/23 2047 09/06/23 0259 09/06/23 0934 09/07/23 0326 09/08/23 0604 09/09/23 0522 09/16/23 0000 11/29/23 0000 01/27/24 0000 02/03/24 0000 02/15/24 0000  NA 132* 134*   < > 137 137 138   < > 139 139 140  --   K 5.3* 5.4*   < > 4.6 4.0 4.4   < > 5.0 4.8 5.1 5.2*  CL 112*  115*   < > 112* 110 115*   < > 110* 111* 115*  --   CO2 11* 14*   < > 17* 20* 19*   < > 21 19 17   --   GLUCOSE 86 102*   < > 144* 106* 102*  --   --   --   --   --   BUN 110* 85*   < > 96* 89* 81*   < > 76* 74* 70*  --   CREATININE 2.02* 2.06*   < > 2.09* 2.14* 1.96*   < > 2.2* 1.8* 2.0*  --   CALCIUM 8.1* 7.8*   < > 7.6* 7.6* 7.8*   < > 9.4 9.3 9.0  --   MG 2.1 2.2  --  2.1  --   --   --   --   --   --   --   PHOS 4.5 4.4  --  3.1  --   --   --   --   --   --   --    < > = values in this interval not displayed.   Recent Labs    09/05/23 1411 09/06/23 0259 09/07/23 0326 01/27/24 0000  AST 23 20 22 22   ALT 27 24 25 19   ALKPHOS 52 49 44 88  BILITOT 0.2* 0.9 0.7  --   PROT 5.5* 4.2* 4.1*  --   ALBUMIN 3.0* 2.0* 1.8* 3.9   Recent Labs    09/06/23 0259 09/07/23 0326 09/08/23 0604 09/08/23 0604 09/16/23 0000 11/29/23 0000 01/27/24 0000 02/03/24 0000  WBC 7.2 6.7 4.5   < > 6.2 4.1 3.2 3.2  NEUTROABS  --  5.9 2.6  --  4,817.00  --  1,510.00  --   HGB 8.0* 7.1* 8.3*  --  9.3* 9.8* 9.8* 9.2*  HCT 23.6* 20.6* 24.3*  --  28* 29* 30* 28*  MCV 97.9 93.6 94.6  --   --   --   --   --   PLT 148* 177 182  --  201 221 218 204   < > = values in this interval not displayed.   Lab Results  Component Value Date   TSH 2.65 01/27/2024   Lab Results  Component Value Date   HGBA1C 5.5 01/27/2024   Lab Results  Component Value Date   CHOL 252 (A) 01/27/2024   HDL 133 (A) 01/27/2024   LDLCALC 105 01/27/2024   TRIG 49 01/27/2024    Significant Diagnostic Results in last 30 days:  No results found.  Assessment/Plan There are no diagnoses linked to this encounter.   Family/ staff Communication:   Labs/tests ordered:

## 2024-05-01 ENCOUNTER — Encounter: Payer: Self-pay | Admitting: Sports Medicine

## 2024-05-09 LAB — COMPREHENSIVE METABOLIC PANEL WITH GFR: Calcium: 8.6 — AB (ref 8.7–10.7)

## 2024-05-09 LAB — BASIC METABOLIC PANEL WITH GFR
BUN: 84 — AB (ref 4–21)
CO2: 19 (ref 13–22)
Chloride: 113 — AB (ref 99–108)
Creatinine: 2.5 — AB (ref 0.5–1.1)
Glucose: 71
Potassium: 5.3 meq/L — AB (ref 3.5–5.1)
Sodium: 139 (ref 137–147)

## 2024-05-10 LAB — CBC AND DIFFERENTIAL
HCT: 23 — AB (ref 36–46)
Hemoglobin: 7.1 — AB (ref 12.0–16.0)
Platelets: 143 10*3/uL — AB (ref 150–400)
WBC: 3.5

## 2024-05-10 LAB — CBC: RBC: 2.19 — AB (ref 3.87–5.11)

## 2024-05-11 ENCOUNTER — Encounter: Payer: Self-pay | Admitting: Nurse Practitioner

## 2024-05-11 ENCOUNTER — Non-Acute Institutional Stay (SKILLED_NURSING_FACILITY): Payer: Self-pay | Admitting: Nurse Practitioner

## 2024-05-11 DIAGNOSIS — M150 Primary generalized (osteo)arthritis: Secondary | ICD-10-CM

## 2024-05-11 DIAGNOSIS — N184 Chronic kidney disease, stage 4 (severe): Secondary | ICD-10-CM

## 2024-05-11 DIAGNOSIS — E785 Hyperlipidemia, unspecified: Secondary | ICD-10-CM | POA: Diagnosis not present

## 2024-05-11 DIAGNOSIS — R600 Localized edema: Secondary | ICD-10-CM

## 2024-05-11 DIAGNOSIS — I10 Essential (primary) hypertension: Secondary | ICD-10-CM

## 2024-05-11 DIAGNOSIS — D631 Anemia in chronic kidney disease: Secondary | ICD-10-CM

## 2024-05-11 DIAGNOSIS — N1832 Chronic kidney disease, stage 3b: Secondary | ICD-10-CM

## 2024-05-11 NOTE — Assessment & Plan Note (Addendum)
 Numbness of the right hand, she was told this is coming from her neck.  OA lower back, knees, R shoulder, R hip, L ankle, chronic, takes Tylenol , declined Gabapentin.

## 2024-05-11 NOTE — Assessment & Plan Note (Signed)
 TSH 2.65 01/27/24, Iron 55, Vit B12 1073 09/05/23, Hgb 9.2 02/03/24(baseline 9-10), Hgb 8.6, Iron 115, Vit B12 659, Folate 9.1 03/02/24. Hgb 7.1 05/10/24 Suggested: Iron 325mg  4x/wk, PPI-Pantoprazole  40mg  every day, FOBT, CBC/diff in am, ED eval and tx if Hgb<7

## 2024-05-11 NOTE — Assessment & Plan Note (Signed)
 baseline creat around 2. Bun/creat 70/2.38 03/02/24

## 2024-05-11 NOTE — Assessment & Plan Note (Signed)
 LDL 105 01/27/24, diet modification

## 2024-05-11 NOTE — Assessment & Plan Note (Signed)
 chronic, mild RLE

## 2024-05-11 NOTE — Progress Notes (Signed)
 Location:  [facility] Room Number: [phone] [id number] Place of Service:  SNF (31) Provider:  [facility] Kathline Banbury, N.P.   No care team member to display  Extended Emergency Contact Information Primary Emergency Contact: Knott,[name] Mobile Phone: [phone] Relation: Other Secondary Emergency Contact: Mazeeva,Natalia Mobile Phone: [phone] Relation: Friend  Code Status:  DNR Goals of care: Advanced Directive information    04/28/2024    2:31 PM  Advanced Directives  Does Patient Have a Medical Advance Directive? Yes  Type of Estate agent of [place];Out of facility DNR (pink MOST or yellow form)  Does patient want to make changes to medical advance directive? No - Patient declined  Copy of Healthcare Power of Attorney in Chart? Yes - validated most recent copy scanned in chart (See row information)     Chief Complaint  Patient presents with   Anemia    HPI:  Pt is a 89 y.o. female seen today for an acute visit for anemia, hemoglobin dropped down to 7.1 05/10/24.  The patient denied ABD pain, nausea, vomiting, queasy stomach, heartburn, indigestion, constipation or diarrhea.  No noted signs symptoms of bleeding.  Hx of GERD, not taking acid reducer presently.               HTN, intermittent elevated Sbp, asymptomatic, the patient stated its not new, hx of prn Hydralazine , declined Amlodipine.              OA lower back, knees, R shoulder, R hip, L ankle, chronic, takes Tylenol , declined Gabapentin.              Edema, chronic, mild RLE             Gait abnormality, walker, w/c, power w/c             CKD  baseline creat around 2. Bun/creat 70/2.38 03/02/24             Anemia, TSH 2.65 01/27/24, Iron 55, Vit B12 1073 09/05/23, Hgb 9.2 02/03/24(baseline 9-10), Hgb 8.6, Iron 115, Vit B12 659, Folate 9.1 03/02/24             Numbness of the right hand, she was told this is coming from her neck.             HLD LDL 105 01/27/24, diet modification.               Rectal prolapse, avoid constipation.      No past medical history on file. No past surgical history on file.  Allergies  Allergen Reactions   Amoxicillin Nausea And Vomiting   Ivp Dye [Iodinated Contrast Media]     Outpatient Encounter Medications as of 05/11/2024  Medication Sig   acetaminophen  (TYLENOL ) 500 MG tablet Take 1,000 mg by mouth every 6 (six) hours as needed for mild pain (pain score 1-3).   Docusate Sodium (COLACE PO) Take 1 tablet by mouth daily as needed.   hydrALAZINE  (APRESOLINE ) 10 MG tablet Take 10 mg by mouth daily as needed.   Pramoxine HCl (CERAVE ITCH RELIEF) 1 % CREA Apply 1 Application topically 2 (two) times daily. Apply to arms, legs, and body trunk   Zinc Oxide 10 % OINT Apply 1 Application topically every 2 (two) hours as needed.   acetaminophen  (TYLENOL ) 325 MG tablet Take 2 tablets (650 mg total) by mouth every 8 (eight) hours as needed (or Fever >/= 101). (Patient not taking: Reported on 05/11/2024)   diclofenac  Sodium (VOLTAREN ) 1 %  GEL Apply 2 g topically every 8 (eight) hours as needed. (Patient not taking: Reported on 05/11/2024)   gabapentin (NEURONTIN) 100 MG capsule Take 100 mg by mouth daily. (Patient not taking: Reported on 05/11/2024)   Menthol, Topical Analgesic, (BIOFREEZE COOL THE PAIN) 4 % GEL Apply 1 Application topically in the morning and at bedtime. Apply to knees &shoulder topically two times a day related to PAIN, UNSPECIFIED  apply to bilateral knees &right shoulder (Patient not taking: Reported on 05/11/2024)   No facility-administered encounter medications on file as of 05/11/2024.    Review of Systems  Constitutional:  Negative for appetite change, fatigue and fever.  HENT:  Negative for congestion and trouble swallowing.   Eyes:  Negative for visual disturbance.  Respiratory:  Negative for cough and shortness of breath.   Cardiovascular:  Positive for leg swelling. Negative for chest pain and palpitations.  Gastrointestinal:   Negative for abdominal pain.       Hx of rectal prolapse, avoid constipation, doing pelvic exercise, occasional fecal incontinence.  Occasionally nauseated and heartburns.   Genitourinary:  Negative for dysuria and urgency.  Musculoskeletal:  Positive for arthralgias, back pain and gait problem.  Skin:  Negative for color change.  Neurological:  Negative for speech difficulty, weakness and headaches.  Hematological:  Positive for adenopathy.  Psychiatric/Behavioral:  Negative for confusion and sleep disturbance. The patient is not nervous/anxious.      There is no immunization history on file for this patient. Pertinent  Health Maintenance Due  Topic Date Due   INFLUENZA VACCINE  06/09/2024   DEXA SCAN  Completed      04/20/2023   10:04 AM 09/24/2023   12:12 PM 10/22/2023    3:08 PM  Fall Risk  Falls in the past year? 1 1 1   Was there an injury with Fall? 0 0 0  Fall Risk Category Calculator 2 1 1   Patient at Risk for Falls Due to History of fall(s);Impaired balance/gait History of fall(s);Impaired balance/gait;Impaired mobility History of fall(s);Impaired balance/gait;Impaired mobility  Fall risk Follow up Falls evaluation completed Falls evaluation completed;Education provided Falls evaluation completed;Education provided   Functional Status Survey:    Vitals:   05/11/24 1114 05/11/24 1120  BP: (!) 174/74 (!) 164/70  Pulse: (!) 50   Weight: 128 lb 8 oz (58.3 kg)   Height: 5' 3 (1.6 m)    Body mass index is 22.76 kg/m. Physical Exam Vitals and nursing note reviewed.  Constitutional:      Appearance: Normal appearance.  HENT:     Head: Normocephalic and atraumatic.     Nose: Nose normal.     Mouth/Throat:     Mouth: Mucous membranes are moist.  Eyes:     Extraocular Movements: Extraocular movements intact.     Conjunctiva/sclera: Conjunctivae normal.     Pupils: Pupils are equal, round, and reactive to light.  Cardiovascular:     Rate and Rhythm: Normal rate  and regular rhythm.     Heart sounds: No murmur heard. Pulmonary:     Effort: Pulmonary effort is normal.     Breath sounds: No rales.  Abdominal:     General: Bowel sounds are normal.     Palpations: Abdomen is soft.     Tenderness: There is no abdominal tenderness.  Musculoskeletal:        General: No tenderness.     Cervical back: Normal range of motion and neck supple.     Right lower leg: Edema present.  Left lower leg: No edema.     Comments: Trace edema RLE  Skin:    General: Skin is warm and dry.  Neurological:     General: No focal deficit present.     Mental Status: She is alert and oriented to person, place, and time. Mental status is at baseline.     Motor: No weakness.     Gait: Gait abnormal.  Psychiatric:        Mood and Affect: Mood normal.        Behavior: Behavior normal.        Thought Content: Thought content normal.        Judgment: Judgment normal.     Labs reviewed: Recent Labs    09/05/23 2047 09/06/23 0259 09/06/23 0934 09/07/23 0326 09/08/23 0604 09/09/23 0522 09/16/23 0000 01/27/24 0000 02/03/24 0000 02/15/24 0000 05/09/24 0000  NA 132* 134*   < > 137 137 138   < > 139 140  --  139  K 5.3* 5.4*   < > 4.6 4.0 4.4   < > 4.8 5.1 5.2* 5.3*  CL 112* 115*   < > 112* 110 115*   < > 111* 115*  --  113*  CO2 11* 14*   < > 17* 20* 19*   < > 19 17  --  19  GLUCOSE 86 102*   < > 144* 106* 102*  --   --   --   --   --   BUN 110* 85*   < > 96* 89* 81*   < > 74* 70*  --  84*  CREATININE 2.02* 2.06*   < > 2.09* 2.14* 1.96*   < > 1.8* 2.0*  --  2.5*  CALCIUM 8.1* 7.8*   < > 7.6* 7.6* 7.8*   < > 9.3 9.0  --  8.6*  MG 2.1 2.2  --  2.1  --   --   --   --   --   --   --   PHOS 4.5 4.4  --  3.1  --   --   --   --   --   --   --    < > = values in this interval not displayed.   Recent Labs    09/05/23 1411 09/06/23 0259 09/07/23 0326 01/27/24 0000  AST 23 20 22 22   ALT 27 24 25 19   ALKPHOS 52 49 44 88  BILITOT 0.2* 0.9 0.7  --   PROT 5.5* 4.2*  4.1*  --   ALBUMIN 3.0* 2.0* 1.8* 3.9   Recent Labs    09/06/23 0259 09/07/23 0326 09/08/23 0604 09/16/23 0000 11/29/23 0000 01/27/24 0000 02/03/24 0000 05/10/24 0000  WBC 7.2 6.7 4.5 6.2   < > 3.2 3.2 3.5  NEUTROABS  --  5.9 2.6 4,817.00  --  1,510.00  --   --   HGB 8.0* 7.1* 8.3* 9.3*   < > 9.8* 9.2* 7.1*  HCT 23.6* 20.6* 24.3* 28*   < > 30* 28* 23*  MCV 97.9 93.6 94.6  --   --   --   --   --   PLT 148* 177 182 201   < > 218 204 143*   < > = values in this interval not displayed.   Lab Results  Component Value Date   TSH 2.65 01/27/2024   Lab Results  Component Value Date   HGBA1C 5.5 01/27/2024   Lab Results  Component Value Date   CHOL 252 (A) 01/27/2024   HDL 133 (A) 01/27/2024   LDLCALC 105 01/27/2024   TRIG 49 01/27/2024    Significant Diagnostic Results in last 30 days:  No results found.  Assessment/Plan Anemia TSH 2.65 01/27/24, Iron 55, Vit B12 1073 09/05/23, Hgb 9.2 02/03/24(baseline 9-10), Hgb 8.6, Iron 115, Vit B12 659, Folate 9.1 03/02/24. Hgb 7.1 05/10/24 Suggested: Iron 325mg  4x/wk, PPI-Pantoprazole  40mg  every day, FOBT, CBC/diff in am, ED eval and tx if Hgb<7  Osteoarthritis, multiple sites Numbness of the right hand, she was told this is coming from her neck.  OA lower back, knees, R shoulder, R hip, L ankle, chronic, takes Tylenol , declined Gabapentin.   HLD (hyperlipidemia) LDL 105 01/27/24, diet modification.   Chronic kidney disease, stage 3b (HCC) baseline creat around 2. Bun/creat 70/2.38 03/02/24  Peripheral edema chronic, mild RLE  HTN (hypertension) intermittent elevated Sbp, asymptomatic, the patient stated its not new, hx of prn Hydralazine , declined Amlodipine.     Family/ staff Communication: plan of care reviewed with the patient and charge nurse.  Labs/tests ordered:  CBC/diff, FOBT

## 2024-05-11 NOTE — Assessment & Plan Note (Signed)
 intermittent elevated Sbp, asymptomatic, the patient stated its not new, hx of prn Hydralazine , declined Amlodipine.

## 2024-05-15 ENCOUNTER — Encounter: Payer: Self-pay | Admitting: Nurse Practitioner

## 2024-05-16 ENCOUNTER — Non-Acute Institutional Stay (SKILLED_NURSING_FACILITY): Payer: Self-pay | Admitting: Nurse Practitioner

## 2024-05-16 DIAGNOSIS — R54 Age-related physical debility: Secondary | ICD-10-CM | POA: Diagnosis not present

## 2024-05-16 DIAGNOSIS — K219 Gastro-esophageal reflux disease without esophagitis: Secondary | ICD-10-CM | POA: Diagnosis not present

## 2024-05-16 DIAGNOSIS — N1832 Chronic kidney disease, stage 3b: Secondary | ICD-10-CM

## 2024-05-16 DIAGNOSIS — I10 Essential (primary) hypertension: Secondary | ICD-10-CM

## 2024-05-16 NOTE — Assessment & Plan Note (Signed)
 Bun/creat 66/2.03 04/04/24

## 2024-05-16 NOTE — Assessment & Plan Note (Signed)
 declined PPI

## 2024-05-16 NOTE — Progress Notes (Signed)
 Location:  [facility]   Nursing Home Room Number: 57-A Place of Service:  SNF (31) Provider: Adisyn Ruscitti X, NP   No primary care provider on file.  No care team member to display  Extended Emergency Contact Information Primary Emergency Contact: Knott,[name] Mobile Phone: [phone] Relation: Other Secondary Emergency Contact: Mazeeva,Natalia Mobile Phone: [phone] Relation: Friend  Code Status:  DNR Goals of care: Advanced Directive information    05/16/2024   11:36 AM  Advanced Directives  Does Patient Have a Medical Advance Directive? Yes  Type of Estate agent of [place];Out of facility DNR (pink MOST or yellow form)  Does patient want to make changes to medical advance directive? No - Patient declined  Copy of Healthcare Power of Attorney in Chart? Yes - validated most recent copy scanned in chart (See row information)     Chief Complaint  Patient presents with   Medical Management of Chronic Issues    Routine visit    HPI:  Pt is a 89 y.o. female seen today for medical management of chronic diseases.    Adult failure to thrive, comfort measures. Under hospice service             Hx of GERD, declined PPI              HTN, intermittent elevated Sbp, asymptomatic, the patient declined antihypertensive agents             OA lower back, knees, R shoulder, R hip, L ankle, chronic, takes Tylenol , declined Gabapentin.              Edema, chronic, mild RLE             Gait abnormality, walker, w/c, power w/c             CKD  baseline creat around 2. Bun/creat 66/2.03 04/04/24             Anemia, 05/11/24 TSH 2.65 01/27/24, Iron 55, Vit B12 1073 09/05/23, Hgb 9.2 02/03/24(baseline 9-10), Hgb 8.6, Iron 115, Vit B12 659, Folate 9.1 03/02/24. Hgb 7.1 05/10/24 Suggested: Iron 325mg  4x/wk, PPI-Pantoprazole  40mg  every day, FOBT, CBC/diff in am, ED eval and tx if Hgb<7-the patient declined             Numbness of the right hand, she was told this is  coming from her neck.             HLD LDL 105 01/27/24, diet modification.              Rectal prolapse, avoid constipation.    No past medical history on file. No past surgical history on file.  Allergies  Allergen Reactions   Amoxicillin Nausea And Vomiting   Ivp Dye [Iodinated Contrast Media]     Allergies as of 05/16/2024       Reactions   Amoxicillin Nausea And Vomiting   Ivp Dye [iodinated Contrast Media]         Medication List        Accurate as of May 16, 2024 11:59 PM. If you have any questions, ask your nurse or doctor.          STOP taking these medications    Biofreeze Cool The Pain 4 % Gel Generic drug: Menthol (Topical Analgesic)   diclofenac  Sodium 1 % Gel Commonly known as: VOLTAREN    gabapentin 100 MG capsule Commonly known as: NEURONTIN       TAKE these medications  acetaminophen  500 MG tablet Commonly known as: TYLENOL  Take 1,000 mg by mouth every 6 (six) hours as needed for mild pain (pain score 1-3). What changed: Another medication with the same name was removed. Continue taking this medication, and follow the directions you see here.   CeraVe Itch Relief 1 % Crea Generic drug: Pramoxine HCl Apply 1 Application topically 2 (two) times daily. Apply to arms, legs, and body trunk   COLACE PO Take 1 tablet by mouth daily as needed.   hydrALAZINE  10 MG tablet Commonly known as: APRESOLINE  Take 10 mg by mouth daily as needed.   Zinc Oxide 10 % Oint Apply 1 Application topically every 2 (two) hours as needed.        Review of Systems  Constitutional:  Positive for appetite change and fatigue. Negative for fever.  HENT:  Negative for congestion and trouble swallowing.   Eyes:  Negative for visual disturbance.  Respiratory:  Negative for cough and shortness of breath.   Cardiovascular:  Positive for leg swelling. Negative for chest pain and palpitations.  Gastrointestinal:  Negative for abdominal pain.       Hx of rectal  prolapse, avoid constipation, doing pelvic exercise, occasional fecal incontinence.  Occasionally nauseated and heartburns.   Genitourinary:  Negative for dysuria and urgency.  Musculoskeletal:  Positive for arthralgias, back pain and gait problem.  Skin:  Negative for color change.  Neurological:  Negative for speech difficulty, weakness and headaches.  Hematological:  Positive for adenopathy.  Psychiatric/Behavioral:  Negative for confusion and sleep disturbance. The patient is not nervous/anxious.     Immunization History  Administered Date(s) Administered   Hep A, Unspecified 05/05/2007   Hepatitis A, Ped/Adol-2 Dose 01/27/2000, 11/04/2006   Polio, Unspecified 05/18/1994   Td 05/18/1994, 08/23/2006   Typhoid Live 11/19/1997   Yellow Fever 11/19/1997   Pertinent  Health Maintenance Due  Topic Date Due   INFLUENZA VACCINE  06/09/2024   DEXA SCAN  Completed      04/20/2023   10:04 AM 09/24/2023   12:12 PM 10/22/2023    3:08 PM  Fall Risk  Falls in the past year? 1 1 1   Was there an injury with Fall? 0 0 0  Fall Risk Category Calculator 2 1 1   Patient at Risk for Falls Due to History of fall(s);Impaired balance/gait History of fall(s);Impaired balance/gait;Impaired mobility History of fall(s);Impaired balance/gait;Impaired mobility  Fall risk Follow up Falls evaluation completed Falls evaluation completed;Education provided Falls evaluation completed;Education provided   Functional Status Survey:    Vitals:   05/16/24 1135  BP: (!) 154/78  Pulse: 73  Resp: 18  Temp: (!) 97.3 F (36.3 C)  SpO2: 97%  Weight: 128 lb 8 oz (58.3 kg)   Body mass index is 22.76 kg/m. Physical Exam Vitals and nursing note reviewed.  Constitutional:      Comments: Frail, fatigue  HENT:     Head: Normocephalic and atraumatic.     Nose: Nose normal.     Mouth/Throat:     Mouth: Mucous membranes are moist.  Eyes:     Extraocular Movements: Extraocular movements intact.      Conjunctiva/sclera: Conjunctivae normal.     Pupils: Pupils are equal, round, and reactive to light.  Cardiovascular:     Rate and Rhythm: Normal rate and regular rhythm.     Heart sounds: No murmur heard. Pulmonary:     Effort: Pulmonary effort is normal.     Breath sounds: No rales.  Abdominal:  General: Bowel sounds are normal.     Palpations: Abdomen is soft.     Tenderness: There is no abdominal tenderness.  Musculoskeletal:        General: No tenderness.     Cervical back: Normal range of motion and neck supple.     Right lower leg: Edema present.     Left lower leg: No edema.     Comments: Trace edema RLE  Skin:    General: Skin is warm and dry.  Neurological:     General: No focal deficit present.     Mental Status: She is alert and oriented to person, place, and time. Mental status is at baseline.     Motor: No weakness.     Gait: Gait abnormal.  Psychiatric:        Mood and Affect: Mood normal.        Behavior: Behavior normal.        Thought Content: Thought content normal.        Judgment: Judgment normal.     Labs reviewed: Recent Labs    09/05/23 2047 09/06/23 0259 09/06/23 0934 09/07/23 0326 09/08/23 0604 09/09/23 0522 09/16/23 0000 01/27/24 0000 02/03/24 0000 02/15/24 0000 05/09/24 0000  NA 132* 134*   < > 137 137 138   < > 139 140  --  139  K 5.3* 5.4*   < > 4.6 4.0 4.4   < > 4.8 5.1 5.2* 5.3*  CL 112* 115*   < > 112* 110 115*   < > 111* 115*  --  113*  CO2 11* 14*   < > 17* 20* 19*   < > 19 17  --  19  GLUCOSE 86 102*   < > 144* 106* 102*  --   --   --   --   --   BUN 110* 85*   < > 96* 89* 81*   < > 74* 70*  --  84*  CREATININE 2.02* 2.06*   < > 2.09* 2.14* 1.96*   < > 1.8* 2.0*  --  2.5*  CALCIUM 8.1* 7.8*   < > 7.6* 7.6* 7.8*   < > 9.3 9.0  --  8.6*  MG 2.1 2.2  --  2.1  --   --   --   --   --   --   --   PHOS 4.5 4.4  --  3.1  --   --   --   --   --   --   --    < > = values in this interval not displayed.   Recent Labs     09/05/23 1411 09/06/23 0259 09/07/23 0326 01/27/24 0000  AST 23 20 22 22   ALT 27 24 25 19   ALKPHOS 52 49 44 88  BILITOT 0.2* 0.9 0.7  --   PROT 5.5* 4.2* 4.1*  --   ALBUMIN 3.0* 2.0* 1.8* 3.9   Recent Labs    09/06/23 0259 09/07/23 0326 09/08/23 0604 09/16/23 0000 11/29/23 0000 01/27/24 0000 02/03/24 0000 05/10/24 0000  WBC 7.2 6.7 4.5 6.2   < > 3.2 3.2 3.5  NEUTROABS  --  5.9 2.6 4,817.00  --  1,510.00  --   --   HGB 8.0* 7.1* 8.3* 9.3*   < > 9.8* 9.2* 7.1*  HCT 23.6* 20.6* 24.3* 28*   < > 30* 28* 23*  MCV 97.9 93.6 94.6  --   --   --   --   --  PLT 148* 177 182 201   < > 218 204 143*   < > = values in this interval not displayed.   Lab Results  Component Value Date   TSH 2.65 01/27/2024   Lab Results  Component Value Date   HGBA1C 5.5 01/27/2024   Lab Results  Component Value Date   CHOL 252 (A) 01/27/2024   HDL 133 (A) 01/27/2024   LDLCALC 105 01/27/2024   TRIG 49 01/27/2024    Significant Diagnostic Results in last 30 days:  No results found.  Assessment/Plan Frailty syndrome in geriatric patient comfort measures. Under hospice service  Chronic kidney disease, stage 3b (HCC) Bun/creat 66/2.03 04/04/24  HTN (hypertension) intermittent elevated Sbp, asymptomatic, the patient declined antihypertensive agents  GERD (gastroesophageal reflux disease) declined PPI  Osteoarthritis, multiple sites  lower back, knees, R shoulder, R hip, L ankle, chronic, takes Tylenol , declined Gabapentin.      Family/ staff Communication: Plan of care reviewed with the patient and charge nurse  Labs/tests ordered:  none

## 2024-05-16 NOTE — Assessment & Plan Note (Signed)
 comfort measures. Under hospice service

## 2024-05-16 NOTE — Assessment & Plan Note (Signed)
 intermittent elevated Sbp, asymptomatic, the patient declined antihypertensive agents

## 2024-05-16 NOTE — Assessment & Plan Note (Signed)
 lower back, knees, R shoulder, R hip, L ankle, chronic, takes Tylenol , declined Gabapentin.

## 2024-05-19 ENCOUNTER — Encounter: Payer: Self-pay | Admitting: Nurse Practitioner

## 2024-06-08 ENCOUNTER — Telehealth: Payer: Self-pay | Admitting: Sports Medicine

## 2024-06-09 NOTE — Telephone Encounter (Addendum)
 Copied from CRM [id number]. Topic: General - Deceased Patient >> 2024-06-09  3:56 PM Alfonso ORN wrote: Name of caller: Darice Budge   Date of death: 06/09/2024   Name of funeral home: [employer] [facility] [employer] number of funeral home: [phone]  Provider that needs to sign form: Sherlynn Madden   Timeline for signing: N/A

## 2024-06-09 DEATH — deceased
